# Patient Record
Sex: Female | Born: 1980 | Race: Black or African American | Hispanic: No | Marital: Single | Smoking: Current every day smoker
Health system: Southern US, Community
[De-identification: ages and names within clinical notes are randomized; demographics above are authoritative.]

## PROBLEM LIST (undated history)

## (undated) DIAGNOSIS — F6381 Intermittent explosive disorder: Secondary | ICD-10-CM

## (undated) DIAGNOSIS — M1990 Unspecified osteoarthritis, unspecified site: Secondary | ICD-10-CM

## (undated) DIAGNOSIS — E669 Obesity, unspecified: Secondary | ICD-10-CM

## (undated) DIAGNOSIS — M25569 Pain in unspecified knee: Secondary | ICD-10-CM

## (undated) DIAGNOSIS — F32A Depression, unspecified: Secondary | ICD-10-CM

## (undated) DIAGNOSIS — F419 Anxiety disorder, unspecified: Secondary | ICD-10-CM

## (undated) DIAGNOSIS — I10 Essential (primary) hypertension: Secondary | ICD-10-CM

## (undated) HISTORY — PX: CHOLECYSTECTOMY: SHX55

## (undated) HISTORY — DX: Intermittent explosive disorder: F63.81

## (undated) HISTORY — DX: Anxiety disorder, unspecified: F41.9

## (undated) HISTORY — DX: Depression, unspecified: F32.A

---
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
## 2002-01-22 ENCOUNTER — Inpatient Hospital Stay (HOSPITAL_COMMUNITY): Admission: AD | Admit: 2002-01-22 | Discharge: 2002-01-22 | Payer: Self-pay | Admitting: Obstetrics and Gynecology

## 2002-01-22 ENCOUNTER — Encounter: Payer: Self-pay | Admitting: Obstetrics and Gynecology

## 2002-02-21 ENCOUNTER — Inpatient Hospital Stay (HOSPITAL_COMMUNITY): Admission: AD | Admit: 2002-02-21 | Discharge: 2002-02-21 | Payer: Self-pay | Admitting: *Deleted

## 2002-03-05 ENCOUNTER — Inpatient Hospital Stay (HOSPITAL_COMMUNITY): Admission: AD | Admit: 2002-03-05 | Discharge: 2002-03-05 | Payer: Self-pay | Admitting: Obstetrics and Gynecology

## 2002-04-01 ENCOUNTER — Inpatient Hospital Stay (HOSPITAL_COMMUNITY): Admission: AD | Admit: 2002-04-01 | Discharge: 2002-04-01 | Payer: Self-pay | Admitting: Family Medicine

## 2002-05-07 ENCOUNTER — Inpatient Hospital Stay (HOSPITAL_COMMUNITY): Admission: AD | Admit: 2002-05-07 | Discharge: 2002-05-07 | Payer: Self-pay | Admitting: Obstetrics

## 2002-05-23 ENCOUNTER — Inpatient Hospital Stay (HOSPITAL_COMMUNITY): Admission: AD | Admit: 2002-05-23 | Discharge: 2002-05-23 | Payer: Self-pay | Admitting: Obstetrics

## 2002-05-28 ENCOUNTER — Encounter: Payer: Self-pay | Admitting: Obstetrics & Gynecology

## 2002-05-28 ENCOUNTER — Inpatient Hospital Stay (HOSPITAL_COMMUNITY): Admission: RE | Admit: 2002-05-28 | Discharge: 2002-05-28 | Payer: Self-pay | Admitting: Obstetrics & Gynecology

## 2002-06-02 ENCOUNTER — Inpatient Hospital Stay (HOSPITAL_COMMUNITY): Admission: AD | Admit: 2002-06-02 | Discharge: 2002-06-06 | Payer: Self-pay | Admitting: Obstetrics & Gynecology

## 2002-06-17 ENCOUNTER — Inpatient Hospital Stay (HOSPITAL_COMMUNITY): Admission: AD | Admit: 2002-06-17 | Discharge: 2002-06-17 | Payer: Self-pay | Admitting: Obstetrics

## 2002-06-29 ENCOUNTER — Emergency Department (HOSPITAL_COMMUNITY): Admission: EM | Admit: 2002-06-29 | Discharge: 2002-06-30 | Payer: Self-pay | Admitting: Emergency Medicine

## 2002-07-15 ENCOUNTER — Emergency Department (HOSPITAL_COMMUNITY): Admission: EM | Admit: 2002-07-15 | Discharge: 2002-07-15 | Payer: Self-pay | Admitting: Emergency Medicine

## 2002-07-15 ENCOUNTER — Encounter: Payer: Self-pay | Admitting: Emergency Medicine

## 2002-09-20 ENCOUNTER — Emergency Department (HOSPITAL_COMMUNITY): Admission: EM | Admit: 2002-09-20 | Discharge: 2002-09-20 | Payer: Self-pay | Admitting: Emergency Medicine

## 2002-10-22 ENCOUNTER — Encounter: Payer: Self-pay | Admitting: Emergency Medicine

## 2002-10-22 ENCOUNTER — Emergency Department (HOSPITAL_COMMUNITY): Admission: EM | Admit: 2002-10-22 | Discharge: 2002-10-22 | Payer: Self-pay | Admitting: Emergency Medicine

## 2002-11-05 ENCOUNTER — Emergency Department (HOSPITAL_COMMUNITY): Admission: EM | Admit: 2002-11-05 | Discharge: 2002-11-05 | Payer: Self-pay | Admitting: Emergency Medicine

## 2003-01-08 ENCOUNTER — Emergency Department (HOSPITAL_COMMUNITY): Admission: EM | Admit: 2003-01-08 | Discharge: 2003-01-08 | Payer: Self-pay | Admitting: Emergency Medicine

## 2003-03-02 ENCOUNTER — Emergency Department (HOSPITAL_COMMUNITY): Admission: EM | Admit: 2003-03-02 | Discharge: 2003-03-02 | Payer: Self-pay | Admitting: Emergency Medicine

## 2003-03-15 ENCOUNTER — Emergency Department (HOSPITAL_COMMUNITY): Admission: EM | Admit: 2003-03-15 | Discharge: 2003-03-15 | Payer: Self-pay | Admitting: Emergency Medicine

## 2003-03-17 ENCOUNTER — Emergency Department (HOSPITAL_COMMUNITY): Admission: EM | Admit: 2003-03-17 | Discharge: 2003-03-17 | Payer: Self-pay | Admitting: Emergency Medicine

## 2003-03-20 ENCOUNTER — Emergency Department (HOSPITAL_COMMUNITY): Admission: EM | Admit: 2003-03-20 | Discharge: 2003-03-20 | Payer: Self-pay | Admitting: Emergency Medicine

## 2003-03-20 ENCOUNTER — Emergency Department (HOSPITAL_COMMUNITY): Admission: EM | Admit: 2003-03-20 | Discharge: 2003-03-20 | Payer: Self-pay

## 2003-05-09 ENCOUNTER — Emergency Department (HOSPITAL_COMMUNITY): Admission: EM | Admit: 2003-05-09 | Discharge: 2003-05-09 | Payer: Self-pay | Admitting: Emergency Medicine

## 2003-05-20 ENCOUNTER — Emergency Department (HOSPITAL_COMMUNITY): Admission: EM | Admit: 2003-05-20 | Discharge: 2003-05-20 | Payer: Self-pay | Admitting: Emergency Medicine

## 2003-07-05 ENCOUNTER — Inpatient Hospital Stay (HOSPITAL_COMMUNITY): Admission: AD | Admit: 2003-07-05 | Discharge: 2003-07-05 | Payer: Self-pay | Admitting: Obstetrics

## 2003-07-12 ENCOUNTER — Emergency Department (HOSPITAL_COMMUNITY): Admission: EM | Admit: 2003-07-12 | Discharge: 2003-07-12 | Payer: Self-pay | Admitting: Emergency Medicine

## 2003-12-17 ENCOUNTER — Emergency Department (HOSPITAL_COMMUNITY): Admission: EM | Admit: 2003-12-17 | Discharge: 2003-12-17 | Payer: Self-pay

## 2003-12-20 ENCOUNTER — Emergency Department (HOSPITAL_COMMUNITY): Admission: EM | Admit: 2003-12-20 | Discharge: 2003-12-20 | Payer: Self-pay | Admitting: Emergency Medicine

## 2006-03-04 ENCOUNTER — Emergency Department (HOSPITAL_COMMUNITY): Admission: EM | Admit: 2006-03-04 | Discharge: 2006-03-04 | Payer: Self-pay | Admitting: Emergency Medicine

## 2006-03-06 ENCOUNTER — Emergency Department (HOSPITAL_COMMUNITY): Admission: EM | Admit: 2006-03-06 | Discharge: 2006-03-06 | Payer: Self-pay | Admitting: Emergency Medicine

## 2007-02-22 ENCOUNTER — Inpatient Hospital Stay (HOSPITAL_COMMUNITY): Admission: AD | Admit: 2007-02-22 | Discharge: 2007-02-22 | Payer: Self-pay | Admitting: Obstetrics & Gynecology

## 2007-03-01 ENCOUNTER — Inpatient Hospital Stay (HOSPITAL_COMMUNITY): Admission: AD | Admit: 2007-03-01 | Discharge: 2007-03-01 | Payer: Self-pay | Admitting: Obstetrics

## 2007-03-09 ENCOUNTER — Inpatient Hospital Stay (HOSPITAL_COMMUNITY): Admission: AD | Admit: 2007-03-09 | Discharge: 2007-03-09 | Payer: Self-pay | Admitting: Obstetrics and Gynecology

## 2007-04-07 ENCOUNTER — Inpatient Hospital Stay (HOSPITAL_COMMUNITY): Admission: AD | Admit: 2007-04-07 | Discharge: 2007-04-07 | Payer: Self-pay | Admitting: Obstetrics and Gynecology

## 2007-04-30 ENCOUNTER — Inpatient Hospital Stay (HOSPITAL_COMMUNITY): Admission: AD | Admit: 2007-04-30 | Discharge: 2007-04-30 | Payer: Self-pay | Admitting: Obstetrics & Gynecology

## 2007-06-06 ENCOUNTER — Ambulatory Visit (HOSPITAL_COMMUNITY): Admission: RE | Admit: 2007-06-06 | Discharge: 2007-06-06 | Payer: Self-pay | Admitting: Obstetrics & Gynecology

## 2007-06-18 ENCOUNTER — Ambulatory Visit (HOSPITAL_COMMUNITY): Admission: RE | Admit: 2007-06-18 | Discharge: 2007-06-18 | Payer: Self-pay | Admitting: Internal Medicine

## 2007-07-10 ENCOUNTER — Inpatient Hospital Stay (HOSPITAL_COMMUNITY): Admission: AD | Admit: 2007-07-10 | Discharge: 2007-07-10 | Payer: Self-pay | Admitting: Family Medicine

## 2007-07-22 ENCOUNTER — Ambulatory Visit (HOSPITAL_COMMUNITY): Admission: RE | Admit: 2007-07-22 | Discharge: 2007-07-22 | Payer: Self-pay | Admitting: Obstetrics & Gynecology

## 2007-08-19 ENCOUNTER — Ambulatory Visit (HOSPITAL_COMMUNITY): Admission: RE | Admit: 2007-08-19 | Discharge: 2007-08-19 | Payer: Self-pay | Admitting: Obstetrics & Gynecology

## 2007-08-20 ENCOUNTER — Ambulatory Visit: Payer: Self-pay | Admitting: Obstetrics and Gynecology

## 2007-08-20 ENCOUNTER — Inpatient Hospital Stay (HOSPITAL_COMMUNITY): Admission: AD | Admit: 2007-08-20 | Discharge: 2007-08-20 | Payer: Self-pay | Admitting: Family Medicine

## 2007-08-28 ENCOUNTER — Ambulatory Visit: Payer: Self-pay | Admitting: Family Medicine

## 2007-08-28 ENCOUNTER — Inpatient Hospital Stay (HOSPITAL_COMMUNITY): Admission: RE | Admit: 2007-08-28 | Discharge: 2007-08-28 | Payer: Self-pay | Admitting: Obstetrics & Gynecology

## 2007-09-16 ENCOUNTER — Ambulatory Visit: Payer: Self-pay | Admitting: Obstetrics and Gynecology

## 2007-09-16 ENCOUNTER — Inpatient Hospital Stay (HOSPITAL_COMMUNITY): Admission: AD | Admit: 2007-09-16 | Discharge: 2007-09-16 | Payer: Self-pay | Admitting: Family Medicine

## 2007-09-16 ENCOUNTER — Encounter: Payer: Self-pay | Admitting: *Deleted

## 2007-09-19 ENCOUNTER — Inpatient Hospital Stay (HOSPITAL_COMMUNITY): Admission: AD | Admit: 2007-09-19 | Discharge: 2007-09-19 | Payer: Self-pay | Admitting: Obstetrics & Gynecology

## 2010-02-13 ENCOUNTER — Encounter: Payer: Self-pay | Admitting: Emergency Medicine

## 2010-02-14 ENCOUNTER — Encounter: Payer: Self-pay | Admitting: *Deleted

## 2010-10-13 LAB — URINALYSIS, ROUTINE W REFLEX MICROSCOPIC
Bilirubin Urine: NEGATIVE
Glucose, UA: NEGATIVE
Hgb urine dipstick: NEGATIVE
Ketones, ur: NEGATIVE
Nitrite: NEGATIVE
Protein, ur: NEGATIVE
Specific Gravity, Urine: 1.025
Urobilinogen, UA: 0.2
pH: 5.5

## 2010-10-13 LAB — POCT PREGNANCY, URINE
Operator id: 11897
Preg Test, Ur: POSITIVE

## 2010-10-13 LAB — GC/CHLAMYDIA PROBE AMP, GENITAL
Chlamydia, DNA Probe: NEGATIVE
GC Probe Amp, Genital: NEGATIVE

## 2010-10-13 LAB — WET PREP, GENITAL
Clue Cells Wet Prep HPF POC: NONE SEEN
Yeast Wet Prep HPF POC: NONE SEEN

## 2010-10-13 LAB — ABO/RH: ABO/RH(D): O POS

## 2010-10-13 LAB — HCG, QUANTITATIVE, PREGNANCY: hCG, Beta Chain, Quant, S: 1496 — ABNORMAL HIGH

## 2010-10-14 LAB — URINALYSIS, ROUTINE W REFLEX MICROSCOPIC
Glucose, UA: NEGATIVE
Hgb urine dipstick: NEGATIVE
Nitrite: NEGATIVE
Protein, ur: NEGATIVE
Protein, ur: NEGATIVE
Specific Gravity, Urine: 1.01
Urobilinogen, UA: 1
Urobilinogen, UA: 0.2

## 2010-10-14 LAB — HCG, QUANTITATIVE, PREGNANCY: hCG, Beta Chain, Quant, S: 5013 — ABNORMAL HIGH

## 2010-10-17 LAB — URINALYSIS, ROUTINE W REFLEX MICROSCOPIC
Bilirubin Urine: NEGATIVE
Ketones, ur: NEGATIVE
Nitrite: NEGATIVE
Protein, ur: NEGATIVE
pH: 5.5

## 2010-10-17 LAB — WET PREP, GENITAL

## 2010-10-18 LAB — WET PREP, GENITAL
Trich, Wet Prep: NONE SEEN
Yeast Wet Prep HPF POC: NONE SEEN

## 2010-10-18 LAB — GC/CHLAMYDIA PROBE AMP, GENITAL: Chlamydia, DNA Probe: NEGATIVE

## 2010-10-20 LAB — URINALYSIS, ROUTINE W REFLEX MICROSCOPIC
Bilirubin Urine: NEGATIVE
Glucose, UA: NEGATIVE
Ketones, ur: 15 — AB
Nitrite: NEGATIVE
Protein, ur: NEGATIVE
pH: 5.5

## 2010-10-20 LAB — WET PREP, GENITAL
Trich, Wet Prep: NONE SEEN
Yeast Wet Prep HPF POC: NONE SEEN

## 2010-10-21 LAB — COMPREHENSIVE METABOLIC PANEL
ALT: 13
AST: 17
Albumin: 2.6 — ABNORMAL LOW
Alkaline Phosphatase: 76
BUN: 2 — ABNORMAL LOW
CO2: 23
Calcium: 9
Chloride: 103
Creatinine, Ser: 0.47
GFR calc Af Amer: >60
GFR calc non Af Amer: >60
Glucose, Bld: 89
Potassium: 3.7
Sodium: 134 — ABNORMAL LOW
Total Bilirubin: 0.3
Total Protein: 5.9 — ABNORMAL LOW

## 2010-10-21 LAB — CBC
HCT: 36.2
Hemoglobin: 11.9 — ABNORMAL LOW
MCHC: 32.8
MCV: 82.4
Platelets: 176
RBC: 4.4
RDW: 15.3
WBC: 8

## 2010-10-21 LAB — URINALYSIS, ROUTINE W REFLEX MICROSCOPIC
Bilirubin Urine: NEGATIVE
Bilirubin Urine: NEGATIVE
Glucose, UA: NEGATIVE
Hgb urine dipstick: NEGATIVE
Ketones, ur: NEGATIVE
Ketones, ur: NEGATIVE
Nitrite: NEGATIVE
Nitrite: NEGATIVE
Protein, ur: NEGATIVE
Protein, ur: NEGATIVE
Specific Gravity, Urine: 1.02
Urobilinogen, UA: 0.2
Urobilinogen, UA: 0.2
pH: 5.5
pH: 6

## 2010-10-21 LAB — URINE MICROSCOPIC-ADD ON

## 2010-10-21 LAB — WET PREP, GENITAL
Clue Cells Wet Prep HPF POC: NONE SEEN
Clue Cells Wet Prep HPF POC: NONE SEEN
Trich, Wet Prep: NONE SEEN
Trich, Wet Prep: NONE SEEN
Yeast Wet Prep HPF POC: NONE SEEN
Yeast Wet Prep HPF POC: NONE SEEN

## 2010-10-21 LAB — KLEIHAUER-BETKE STAIN
Fetal Cells %: 0
Quantitation Fetal Hemoglobin: 0

## 2011-01-12 ENCOUNTER — Encounter: Payer: Self-pay | Admitting: Emergency Medicine

## 2011-01-12 ENCOUNTER — Emergency Department (HOSPITAL_COMMUNITY): Payer: Self-pay

## 2011-01-12 ENCOUNTER — Other Ambulatory Visit: Payer: Self-pay

## 2011-01-12 ENCOUNTER — Emergency Department (HOSPITAL_COMMUNITY)
Admission: EM | Admit: 2011-01-12 | Discharge: 2011-01-13 | Disposition: A | Discharge status: Home / Self Care | Payer: Self-pay | Attending: Emergency Medicine | Admitting: Emergency Medicine

## 2011-01-12 DIAGNOSIS — R197 Diarrhea, unspecified: Secondary | ICD-10-CM | POA: Insufficient documentation

## 2011-01-12 DIAGNOSIS — F17200 Nicotine dependence, unspecified, uncomplicated: Secondary | ICD-10-CM | POA: Insufficient documentation

## 2011-01-12 DIAGNOSIS — I10 Essential (primary) hypertension: Secondary | ICD-10-CM | POA: Insufficient documentation

## 2011-01-12 DIAGNOSIS — M79609 Pain in unspecified limb: Secondary | ICD-10-CM | POA: Insufficient documentation

## 2011-01-12 DIAGNOSIS — R0789 Other chest pain: Secondary | ICD-10-CM | POA: Insufficient documentation

## 2011-01-12 DIAGNOSIS — R0602 Shortness of breath: Secondary | ICD-10-CM | POA: Insufficient documentation

## 2011-01-12 DIAGNOSIS — R059 Cough, unspecified: Secondary | ICD-10-CM | POA: Insufficient documentation

## 2011-01-12 DIAGNOSIS — R05 Cough: Secondary | ICD-10-CM

## 2011-01-12 DIAGNOSIS — R110 Nausea: Secondary | ICD-10-CM | POA: Insufficient documentation

## 2011-01-12 HISTORY — DX: Obesity, unspecified: E66.9

## 2011-01-12 HISTORY — DX: Essential (primary) hypertension: I10

## 2011-01-12 LAB — POCT I-STAT, CHEM 8
BUN: 11 mg/dL (ref 6–23)
Chloride: 109 mEq/L (ref 96–112)
Sodium: 144 mEq/L (ref 135–145)

## 2011-01-12 NOTE — ED Notes (Signed)
PT. REPORTS SUBSTERNAL CHEST PAIN RADIATING TO LEFT SHOULDER AND LEFT ARM ONSET THIS EVENING ,  SOB , DENIES COUGH ,  SLIGHT NAUSEA , DENIES DIAPHORESIS .

## 2011-01-12 NOTE — ED Provider Notes (Signed)
History     CSN: [id number]  Arrival date & time 01/12/11  2001   First MD Initiated Contact with Patient 01/12/11 2320      Chief Complaint  Patient presents with  . Chest Pain    HPI  History provided by the patient. Patient is a 30 year old female with history of hypertension who presents with complaints of chest pain and tightness that began around 5 PM today. Pain radiates to left upper extremity. Patient denies any aggravating or alleviating factors. Pain is nonpleuritic. Patient reports having a slight cough. She also reports having 4 episodes of loose "diarrhea" like stools. Patient denies any vomiting. She denies any abdominal pains. Patient has no recent travel, surgery, history of cancer, history of DVT, use of estrogen or birth control, no hemoptysis. Patient has no other significant past medical history.   Past Medical History  Diagnosis Date  . Hypertension   . Obesity     Past Surgical History  Procedure Date  . Cholecystectomy     No family history on file.  History  Substance Use Topics  . Smoking status: Current Everyday Smoker  . Smokeless tobacco: Not on file  . Alcohol Use: No    OB History    Grav Para Term Preterm Abortions TAB SAB Ect Mult Living                  Review of Systems  Constitutional: Negative for fever, chills and diaphoresis.  Respiratory: Positive for cough and shortness of breath. Negative for wheezing.   Cardiovascular: Positive for chest pain.  Gastrointestinal: Positive for nausea and diarrhea. Negative for vomiting.  Neurological: Negative for dizziness and light-headedness.  All other systems reviewed and are negative.    Allergies  Review of patient's allergies indicates no known allergies.  Home Medications   Current Outpatient Rx  Name Route Sig Dispense Refill  . TRIAMTERENE-HCTZ 37.5-25 MG PO TABS Oral Take 1 tablet by mouth daily.        BP 132/72  Pulse 58  Temp(Src) 99.5 F (37.5 C) (Oral)  Resp  20  SpO2 96%  LMP 01/07/2011  Physical Exam  Nursing note and vitals reviewed. Constitutional: She is oriented to person, place, and time. She appears well-developed and well-nourished. No distress.       Obese  HENT:  Head: Normocephalic.  Cardiovascular: Normal rate, regular rhythm and normal heart sounds.   Pulmonary/Chest: Effort normal and breath sounds normal. She has no wheezes. She has no rales. She exhibits no tenderness.  Abdominal: Soft. There is no tenderness.  Musculoskeletal: Normal range of motion.  Neurological: She is alert and oriented to person, place, and time.  Skin: Skin is warm and dry. No rash noted.  Psychiatric: She has a normal mood and affect. Her behavior is normal.    ED Course  Procedures (including critical care time)  Labs Reviewed  POCT I-STAT, CHEM 8 - Abnormal; Notable for the following:    Hemoglobin 11.9 (*)    HCT 35.0 (*)    All other components within normal limits  URINE RAPID DRUG SCREEN (HOSP PERFORMED)  POCT PREGNANCY, URINE  LAB REPORT - SCANNED    Results for orders placed during the hospital encounter of 01/12/11  URINE RAPID DRUG SCREEN (HOSP PERFORMED)      Component Value Range   Opiates NONE DETECTED  NONE DETECTED    Cocaine NONE DETECTED  NONE DETECTED    Benzodiazepines NONE DETECTED  NONE DETECTED  Amphetamines NONE DETECTED  NONE DETECTED    Tetrahydrocannabinol NONE DETECTED  NONE DETECTED    Barbiturates NONE DETECTED  NONE DETECTED   POCT I-STAT, CHEM 8      Component Value Range   Sodium 144  135 - 145 (mEq/L)   Potassium 3.9  3.5 - 5.1 (mEq/L)   Chloride 109  96 - 112 (mEq/L)   BUN 11  6 - 23 (mg/dL)   Creatinine, Ser 1.47  0.50 - 1.10 (mg/dL)   Glucose, Bld 85  70 - 99 (mg/dL)   Calcium, Ion 8.29  5.62 - 1.32 (mmol/L)   TCO2 24  0 - 100 (mmol/L)   Hemoglobin 11.9 (*) 12.0 - 15.0 (g/dL)   HCT 13.0 (*) 86.5 - 46.0 (%)  POCT PREGNANCY, URINE      Component Value Range   Preg Test, Ur NEGATIVE         Dg Chest 2 View  01/12/2011  *RADIOLOGY REPORT*  Clinical Data: Chest pain  CHEST - 2 VIEW  Comparison: 03/06/2006.  Findings: Low lung volumes.  Mild interstitial prominence could represent chronic bronchitis or viral pneumonitis.  Slight worsening aeration compared with priors.  No lobar consolidation, effusion, or pneumothorax.  Normal heart size for the degree of inspiration.  IMPRESSION:  Mild interstitial prominence.  Question chronic bronchitis or viral pneumonitis.  Low lung volumes.  Original Report Authenticated By: Elsie Stain, M.D.     1. Cough   2. Chest pain       MDM  11:15 PM patient seen and evaluated. Patient in no acute distress.  Pt is PERC negative.  Pt also complains of being out of BP meds.  Will give refill for her normal Rx.     Date: 01/12/2011  Rate: 65  Rhythm: normal sinus rhythm  QRS Axis: normal  Intervals: normal  ST/T Wave abnormalities: normal  Conduction Disutrbances:none  Narrative Interpretation:   Old EKG Reviewed: unchanged from 07/12/2003       Angus Seller, PA 01/13/11 [phone]

## 2011-01-12 NOTE — ED Notes (Signed)
The pt is sound asleep.  She is in no distress

## 2011-01-13 LAB — RAPID URINE DRUG SCREEN, HOSP PERFORMED
Barbiturates: NOT DETECTED
Cocaine: NOT DETECTED

## 2011-01-13 MED ORDER — IBUPROFEN 800 MG PO TABS
800.0000 mg | ORAL_TABLET | Freq: Once | ORAL | Status: AC
  Administered 2011-01-13: 800 mg via ORAL
  Filled 2011-01-13: qty 1

## 2011-01-13 MED ORDER — TRIAMTERENE-HCTZ 37.5-25 MG PO TABS: 1.0000 | ORAL_TABLET | Freq: Every day | ORAL | Status: DC

## 2011-01-13 MED ORDER — ALBUTEROL SULFATE HFA 108 (90 BASE) MCG/ACT IN AERS
2.0000 | INHALATION_SPRAY | RESPIRATORY_TRACT | Status: DC | PRN
  Administered 2011-01-13: 2 via RESPIRATORY_TRACT
  Filled 2011-01-13: qty 6.7

## 2011-01-13 NOTE — ED Notes (Signed)
The pt has been asleep since she arrived in stretcher triage.

## 2011-01-13 NOTE — ED Notes (Signed)
i was able to get the pt awake  Finally and  Took her to the br for a urine specimen

## 2011-01-13 NOTE — ED Notes (Signed)
The pt roused up saying she has chest pain for  Awhile worse tonight.  The pt has fallen back to sleep

## 2011-01-13 NOTE — ED Notes (Signed)
The pt  Woke up again and said no one had talked to her.

## 2011-01-14 NOTE — ED Provider Notes (Signed)
Medical screening examination/treatment/procedure(s) were performed by non-physician practitioner and as supervising physician I was immediately available for consultation/collaboration.  Jo Cerone K Mohini Heathcock-Rasch, MD 01/14/11 0133 

## 2011-02-07 ENCOUNTER — Emergency Department (HOSPITAL_COMMUNITY)
Admission: EM | Admit: 2011-02-07 | Discharge: 2011-02-07 | Disposition: A | Discharge status: Home / Self Care | Payer: Self-pay | Attending: Emergency Medicine | Admitting: Emergency Medicine

## 2011-02-07 ENCOUNTER — Emergency Department (HOSPITAL_COMMUNITY): Payer: Self-pay

## 2011-02-07 ENCOUNTER — Encounter (HOSPITAL_COMMUNITY): Payer: Self-pay | Admitting: *Deleted

## 2011-02-07 DIAGNOSIS — S8990XA Unspecified injury of unspecified lower leg, initial encounter: Secondary | ICD-10-CM

## 2011-02-07 DIAGNOSIS — M1711 Unilateral primary osteoarthritis, right knee: Secondary | ICD-10-CM

## 2011-02-07 DIAGNOSIS — W010XXA Fall on same level from slipping, tripping and stumbling without subsequent striking against object, initial encounter: Secondary | ICD-10-CM | POA: Insufficient documentation

## 2011-02-07 DIAGNOSIS — M25569 Pain in unspecified knee: Secondary | ICD-10-CM | POA: Insufficient documentation

## 2011-02-07 DIAGNOSIS — M1710 Unilateral primary osteoarthritis, unspecified knee: Secondary | ICD-10-CM | POA: Insufficient documentation

## 2011-02-07 DIAGNOSIS — I10 Essential (primary) hypertension: Secondary | ICD-10-CM | POA: Insufficient documentation

## 2011-02-07 DIAGNOSIS — IMO0002 Reserved for concepts with insufficient information to code with codable children: Secondary | ICD-10-CM | POA: Insufficient documentation

## 2011-02-07 MED ORDER — OXYCODONE-ACETAMINOPHEN 5-325 MG PO TABS: 2.0000 | ORAL_TABLET | ORAL | Status: AC | PRN

## 2011-02-07 MED ORDER — OXYCODONE-ACETAMINOPHEN 5-325 MG PO TABS: 2.0000 | ORAL_TABLET | ORAL | Status: DC | PRN

## 2011-02-07 MED ORDER — OXYCODONE-ACETAMINOPHEN 5-325 MG PO TABS
1.0000 | ORAL_TABLET | Freq: Once | ORAL | Status: AC
  Administered 2011-02-07: 1 via ORAL
  Filled 2011-02-07: qty 1

## 2011-02-07 NOTE — ED Notes (Signed)
C/o rt knee pain for 3 days.  She has had problems with this knee in the past

## 2011-02-07 NOTE — ED Provider Notes (Signed)
History     CSN: [id number]  Arrival date & time 02/07/11  0004   First MD Initiated Contact with Patient 02/07/11 [phone]      Chief Complaint  Patient presents with  . Knee Pain    (Consider location/radiation/quality/duration/timing/severity/associated sxs/prior treatment) HPI April Warner is a 31 y.o. female  presents with c/o right knee pain leading to desire to be assessed in the ED. The sx(s) have been present for 1 day. Additional concerns are she twisted it and fell while walking. Causative factors are possibly related to the twisting today. Palliative factors are rest. The distress associated is mild. The disorder has been present for 1 day. She intermittently has pain in the right knee. She believes she dislocated the kneecap 10 years ago. She's also been told she had arthritis in the right knee. The she's endotracheal to the emergency room by private vehicle. She denies nausea, vomiting, fever, chills, back pain, or neck pain    Past Medical History  Diagnosis Date  . Hypertension   . Obesity     Past Surgical History  Procedure Date  . Cholecystectomy     History reviewed. No pertinent family history.  History  Substance Use Topics  . Smoking status: Current Everyday Smoker  . Smokeless tobacco: Not on file  . Alcohol Use: No    OB History    Grav Para Term Preterm Abortions TAB SAB Ect Mult Living                  Review of Systems  All other systems reviewed and are negative.    Allergies  Review of patient's allergies indicates no known allergies.  Home Medications   Current Outpatient Rx  Name Route Sig Dispense Refill  . TRIAMTERENE-HCTZ 37.5-25 MG PO TABS Oral Take 1 tablet by mouth daily.     . OXYCODONE-ACETAMINOPHEN 5-325 MG PO TABS Oral Take 2 tablets by mouth every 4 (four) hours as needed for pain. 15 tablet 0    BP 166/87  Pulse 95  Temp(Src) 99.3 F (37.4 C) (Oral)  Resp 19  SpO2 97%  LMP 12/09/2010  Physical Exam    Nursing note and vitals reviewed. Constitutional: She is oriented to person, place, and time. She appears well-developed and well-nourished.       She is morbidly obese  HENT:  Head: Normocephalic and atraumatic.  Neck: Normal range of motion and phonation normal. Neck supple.  Cardiovascular: Intact distal pulses.   Pulmonary/Chest: Effort normal. She exhibits no tenderness.  Musculoskeletal: She exhibits no edema.       Right knee is diffusely tender without swelling, crepitation or palpable effusion. The knee is grossly stable to stress. Patellar extension is intact.  Neurological: She is alert and oriented to person, place, and time. She has normal strength and normal reflexes. She exhibits normal muscle tone.  Skin: Skin is warm and dry.  Psychiatric: She has a normal mood and affect. Her behavior is normal. Judgment and thought content normal.    ED Course  Procedures (including critical care time)  Emergency department treatment: Percocet for pain Ace wrap for support of the right knee.   Labs Reviewed - No data to display Dg Knee Complete 4 Views Right  02/07/2011  *RADIOLOGY REPORT*  Clinical Data: Right knee pain for 3 days.  RIGHT KNEE - COMPLETE 4+ VIEW  Comparison: None.  Findings: There is no evidence of fracture or dislocation.  The joint spaces are preserved.  Mild  marginal osteophyte formation is noted at all three compartments.  A small knee joint effusion is suspected.  The visualized soft tissues are otherwise unremarkable in appearance.  IMPRESSION:  1.  No evidence of fracture or dislocation. 2.  Mild degenerative change at the right knee. 3.  Small knee joint effusion suspected.  Original Report Authenticated By: Tonia Ghent, M.D.     1. Knee injury   2. Degenerative joint disease of knee, right       MDM  Nonspecific knee injury with pain. DJD on x-ray, doubt ligamentous injury       Flint Melter, MD 02/07/11 0425

## 2011-02-07 NOTE — ED Notes (Signed)
Pt back to radio

## 2011-02-07 NOTE — ED Notes (Signed)
Pt up walked to [facility] with slight limp. Talked with pt about need to loose weight to relieve pressure on joints. Pt verbalized understanding and pt has no ride home. No bus passes available. Had narcotic pain med. Less than 2 hours ago and pt has no one to pick her up as she drove self to ER. Rates pain 5.

## 2011-02-07 NOTE — ED Notes (Signed)
Pt became upset in hall after learning no bus passes and that she didn't drive but was dropped off.  Pt on phone and cussing with someone stating she is " mad as Laroy Apple"  And ready to get the F out of here. Told pt that she could wait in lobby til ride comes.

## 2011-02-10 ENCOUNTER — Emergency Department (HOSPITAL_COMMUNITY)
Admission: EM | Admit: 2011-02-10 | Discharge: 2011-02-10 | Disposition: A | Discharge status: Home / Self Care | Payer: Self-pay | Attending: Emergency Medicine | Admitting: Emergency Medicine

## 2011-02-10 ENCOUNTER — Encounter (HOSPITAL_COMMUNITY): Payer: Self-pay | Admitting: Emergency Medicine

## 2011-02-10 DIAGNOSIS — M25569 Pain in unspecified knee: Secondary | ICD-10-CM

## 2011-02-10 DIAGNOSIS — I10 Essential (primary) hypertension: Secondary | ICD-10-CM | POA: Insufficient documentation

## 2011-02-10 MED ORDER — OXYCODONE-ACETAMINOPHEN 5-325 MG PO TABS
1.0000 | ORAL_TABLET | Freq: Once | ORAL | Status: AC
  Administered 2011-02-10: 1 via ORAL
  Filled 2011-02-10: qty 1

## 2011-02-10 MED ORDER — OXYCODONE-ACETAMINOPHEN 5-325 MG PO TABS: 1.0000 | ORAL_TABLET | Freq: Four times a day (QID) | ORAL | Status: AC | PRN

## 2011-02-10 NOTE — ED Notes (Signed)
Pt presenting to ed with c/o bilateral knee pain pt states she is out of her medications. Pt states her knees just feel like they are giving out on her. Pt states she does not have a pcp currently and she was released from prison not to long ago.

## 2011-02-10 NOTE — ED Provider Notes (Signed)
History     CSN: [id number]  Arrival date & time 02/10/11  1419   First MD Initiated Contact with Patient 02/10/11 1435      Chief Complaint  Patient presents with  . Knee Pain    (Consider location/radiation/quality/duration/timing/severity/associated sxs/prior treatment) HPI Comments: Patient was evaluated for right knee pain in the ED three days ago.  At that time an xray was performed, which showed degenerative changes of the right knee, but no fracture or dislocation.  She reports that she has not had an acute injury since that time.  She reports that her knee has felt unstable and she feels like it is going to give out on her.  She was given an ACE wrap while she was in the ED 3 days ago.  She is now requesting for something with more support.  She was given oxycodone prescription while in the ED three days ago, which she reports helped with the pain.  She ran out of the medication.  She reports that she has an appointment scheduled with Orthopedics on 02-15-11.    The history is provided by the patient.    Past Medical History  Diagnosis Date  . Hypertension   . Obesity     Past Surgical History  Procedure Date  . Cholecystectomy     No family history on file.  History  Substance Use Topics  . Smoking status: Current Everyday Smoker  . Smokeless tobacco: Not on file  . Alcohol Use: No    OB History    Grav Para Term Preterm Abortions TAB SAB Ect Mult Living                  Review of Systems  Constitutional: Negative for fever and chills.  Eyes: Negative for visual disturbance.  Respiratory: Negative for shortness of breath.   Cardiovascular: Negative for chest pain.  Musculoskeletal: Positive for arthralgias. Negative for joint swelling and gait problem.  Skin: Negative for color change and rash.  Neurological: Negative for dizziness, syncope and light-headedness.    Allergies  Review of patient's allergies indicates no known allergies.  Home  Medications   Current Outpatient Rx  Name Route Sig Dispense Refill  . OXYCODONE-ACETAMINOPHEN 5-325 MG PO TABS Oral Take 2 tablets by mouth every 4 (four) hours as needed for pain. 15 tablet 0  . TRIAMTERENE-HCTZ 37.5-25 MG PO TABS Oral Take 1 tablet by mouth daily.       BP 157/104  Pulse 84  Temp(Src) 98 F (36.7 C) (Oral)  Resp 20  SpO2 96%  LMP 12/09/2010  Physical Exam  Nursing note and vitals reviewed. Constitutional: She is oriented to person, place, and time. She appears well-developed and well-nourished. No distress.       Patient is morbidly obese.  HENT:  Head: Normocephalic and atraumatic.  Neck: Normal range of motion. Neck supple.  Cardiovascular: Normal rate, regular rhythm and normal heart sounds.   Pulmonary/Chest: Effort normal and breath sounds normal.  Musculoskeletal:       Right knee: She exhibits normal range of motion, no swelling, no effusion, no deformity, no erythema, no LCL laxity and no MCL laxity. no tenderness found.  Neurological: She is alert and oriented to person, place, and time. No sensory deficit. Gait normal.  Skin: Skin is warm and dry. She is not diaphoretic. No erythema.  Psychiatric: She has a normal mood and affect.    ED Course  Procedures (including critical care time)  Labs Reviewed -  No data to display No results found.   No diagnosis found.  3:05 PM Knee brace ordered for patient. 3:21 PM Ortho tech came to the patient's room.  However, the XL knee brace would not fit the patient's knee.    MDM  Patient had a recent xray of her right knee 3 days ago.  No trauma since that time.  Therefore, do not feel that further imaging is indicated.  No erythema, warmth, obvious effusion, or deformity of the knee.  Patient has appointment with orthopedics scheduled on 02-15-11.  Patient given short course of pain medication.  Patient encouraged to follow up with PCP for pain management.  Patient given resource guide.  Patient explained  that narcotic pain medication is typically not given in the ED for chronic problems and that she needs to follow up with orthopedics and find a PCP.        Pascal Lux Moville, PA-C 02/10/11 1537

## 2011-02-11 NOTE — ED Provider Notes (Signed)
Medical screening examination/treatment/procedure(s) were performed by non-physician practitioner and as supervising physician I was immediately available for consultation/collaboration.  Chyla Schlender, MD 02/11/11 2144 

## 2011-06-13 ENCOUNTER — Emergency Department (HOSPITAL_COMMUNITY)
Admission: EM | Admit: 2011-06-13 | Discharge: 2011-06-13 | Disposition: A | Discharge status: Home / Self Care | Payer: Self-pay | Attending: Emergency Medicine | Admitting: Emergency Medicine

## 2011-06-13 ENCOUNTER — Encounter (HOSPITAL_COMMUNITY): Payer: Self-pay | Admitting: *Deleted

## 2011-06-13 DIAGNOSIS — E669 Obesity, unspecified: Secondary | ICD-10-CM | POA: Insufficient documentation

## 2011-06-13 DIAGNOSIS — I10 Essential (primary) hypertension: Secondary | ICD-10-CM | POA: Insufficient documentation

## 2011-06-13 DIAGNOSIS — M25569 Pain in unspecified knee: Secondary | ICD-10-CM | POA: Insufficient documentation

## 2011-06-13 DIAGNOSIS — M7989 Other specified soft tissue disorders: Secondary | ICD-10-CM | POA: Insufficient documentation

## 2011-06-13 HISTORY — DX: Unspecified osteoarthritis, unspecified site: M19.90

## 2011-06-13 MED ORDER — OXYCODONE-ACETAMINOPHEN 5-325 MG PO TABS: 1.0000 | ORAL_TABLET | ORAL | Status: AC | PRN

## 2011-06-13 MED ORDER — OXYCODONE-ACETAMINOPHEN 5-325 MG PO TABS
1.0000 | ORAL_TABLET | Freq: Once | ORAL | Status: AC
  Administered 2011-06-13: 1 via ORAL
  Filled 2011-06-13: qty 1

## 2011-06-13 MED ORDER — IBUPROFEN 600 MG PO TABS: 600.0000 mg | ORAL_TABLET | Freq: Four times a day (QID) | ORAL | Status: AC | PRN

## 2011-06-13 NOTE — ED Notes (Signed)
Per EMS- pt in c/o bilateral knee pain, pt with history of arthritis and states her knees "went out" on her while walking down stairs today, pt ambulatory at house

## 2011-06-13 NOTE — ED Provider Notes (Signed)
History     CSN: [id number]  Arrival date & time 06/13/11  1345   First MD Initiated Contact with Patient 06/13/11 1406      Chief Complaint  Patient presents with  . Knee Pain    (Consider location/radiation/quality/duration/timing/severity/associated sxs/prior treatment) Patient is a 31 y.o. female presenting with knee pain. The history is provided by the patient.  Knee Pain This is a new problem. The current episode started today. The problem occurs constantly. Associated symptoms include arthralgias and joint swelling. Pertinent negatives include no chills, fever, numbness or weakness.  Pt reports right knee pain that started today.  States she has chronic knee pain, states it worsened today. Painful to walk on it. Pt states her knee "gave out." Denies fall or recent injuries. States unable to see orthopedics because cannot pay. States has been working on losing weight. States  already lost 70lb, states working out several times a week. No other complaints. Denies fever, chills.  Past Medical History  Diagnosis Date  . Hypertension   . Obesity   . Arthritis     Past Surgical History  Procedure Date  . Cholecystectomy     History reviewed. No pertinent family history.  History  Substance Use Topics  . Smoking status: Current Everyday Smoker  . Smokeless tobacco: Not on file  . Alcohol Use: No    OB History    Grav Para Term Preterm Abortions TAB SAB Ect Mult Living                  Review of Systems  Constitutional: Negative for fever and chills.  Respiratory: Negative.   Cardiovascular: Negative.   Musculoskeletal: Positive for joint swelling and arthralgias. Negative for back pain.  Skin: Negative.   Neurological: Negative for weakness and numbness.    Allergies  Review of patient's allergies indicates no known allergies.  Home Medications  No current outpatient prescriptions on file.  BP 140/87  Pulse 76  Temp(Src) 98.1 F (36.7 C) (Oral)  Resp  20  SpO2 95%  Physical Exam  Nursing note and vitals reviewed. Constitutional: She is oriented to person, place, and time. She appears well-developed and well-nourished. No distress.  HENT:  Head: Normocephalic.  Eyes: Conjunctivae are normal.  Neck: Neck supple.  Cardiovascular: Normal rate, regular rhythm and normal heart sounds.   Pulmonary/Chest: Effort normal and breath sounds normal. No respiratory distress. She has no wheezes. She has no rales.  Musculoskeletal:       Right knee normal appearing, other than obese. No redness, or obvious swelling. Pain with ROM, full ROM. Pt able to bear weight. Knee joint stable with negative anterior and posterior drawer signs. NO laxity or pain with medial or lateral stress.  Neurological: She is alert and oriented to person, place, and time.  Skin: Skin is warm and dry.  Psychiatric: She has a normal mood and affect.    ED Course  Procedures (including critical care time)  Pt with exacerbation of chronic pain. No new injuries. Exam unremarkable. Will treat with pain medications. Called social worker to speak with pt about resources for her follow up. She has referral to orthopedics but unable to pay co pay there.   1. Knee pain       MDM          Lottie Mussel, PA 06/13/11 1534

## 2011-06-13 NOTE — Progress Notes (Signed)
ED CM contacted to speak with patient about resources by ED RN.  Pt states she is a [employer]ic[employer] who has returned to school with children and spouse Reports she stands on her feet for long periods of time.  Reports she has family members who have requested she get disability/medicaid "for bad arthritis in knees" but she does not want disability/medicaid.  Reports she has only been to ED three times.  CM spoke with pt about disability requirements, DSS application process, [place] self pay pcps/general co pay amounts and possible referral from [employer] for insurance coverage (inclusive health, BCBS, etc). Provided with written information for pcps, medications and possible insurance coverage information.  Pt voiced understanding of information provided.  Pt noted previously to be standing at triage nursing station tearful speaking on telephone.

## 2011-06-13 NOTE — ED Notes (Signed)
Pt states her right knee went out on her while she was going down the stairs but both knees have been bothering her, states she has history of same and is out of pain medication for this

## 2011-06-13 NOTE — Discharge Instructions (Signed)
Keep your knee elevated. Ice several times a day. Try ACE wrap for compression. Ibuprofen for pain. Percocet for severe pain. Follow up with a family doctor or orthopedics as soon as able.   Knee Pain The knee is the complex joint between your thigh and your lower leg. It is made up of bones, tendons, ligaments, and cartilage. The bones that make up the knee are:  The femur in the thigh.   The tibia and fibula in the lower leg.   The patella or kneecap riding in the groove on the lower femur.  CAUSES  Knee pain is a common complaint with many causes. A few of these causes are:  Injury, such as:   A ruptured ligament or tendon injury.   Torn cartilage.   Medical conditions, such as:   Gout   Arthritis   Infections   Overuse, over training or overdoing a physical activity.  Knee pain can be minor or severe. Knee pain can accompany debilitating injury. Minor knee problems often respond well to self-care measures or get well on their own. More serious injuries may need medical intervention or even surgery. SYMPTOMS The knee is complex. Symptoms of knee problems can vary widely. Some of the problems are:  Pain with movement and weight bearing.   Swelling and tenderness.   Buckling of the knee.   Inability to straighten or extend your knee.   Your knee locks and you cannot straighten it.   Warmth and redness with pain and fever.   Deformity or dislocation of the kneecap.  DIAGNOSIS  Determining what is wrong may be very straight forward such as when there is an injury. It can also be challenging because of the complexity of the knee. Tests to make a diagnosis may include:  Your caregiver taking a history and doing a physical exam.   Routine X-rays can be used to rule out other problems. X-rays will not reveal a cartilage tear. Some injuries of the knee can be diagnosed by:   Arthroscopy a surgical technique by which a small video camera is inserted through tiny incisions  on the sides of the knee. This procedure is used to examine and repair internal knee joint problems. Tiny instruments can be used during arthroscopy to repair the torn knee cartilage (meniscus).   Arthrography is a radiology technique. A contrast liquid is directly injected into the knee joint. Internal structures of the knee joint then become visible on X-ray film.   An MRI scan is a non x-ray radiology procedure in which magnetic fields and a computer produce two- or three-dimensional images of the inside of the knee. Cartilage tears are often visible using an MRI scanner. MRI scans have largely replaced arthrography in diagnosing cartilage tears of the knee.   Blood work.   Examination of the fluid that helps to lubricate the knee joint (synovial fluid). This is done by taking a sample out using a needle and a syringe.  TREATMENT The treatment of knee problems depends on the cause. Some of these treatments are:  Depending on the injury, proper casting, splinting, surgery or physical therapy care will be needed.   Give yourself adequate recovery time. Do not overuse your joints. If you begin to get sore during workout routines, back off. Slow down or do fewer repetitions.   For repetitive activities such as cycling or running, maintain your strength and nutrition.   Alternate muscle groups. For example if you are a weight lifter, work the upper body  on one day and the lower body the next.   Either tight or weak muscles do not give the proper support for your knee. Tight or weak muscles do not absorb the stress placed on the knee joint. Keep the muscles surrounding the knee strong.   Take care of mechanical problems.   If you have flat feet, orthotics or special shoes may help. See your caregiver if you need help.   Arch supports, sometimes with wedges on the inner or outer aspect of the heel, can help. These can shift pressure away from the side of the knee most bothered by  osteoarthritis.   A brace called an "unloader" brace also may be used to help ease the pressure on the most arthritic side of the knee.   If your caregiver has prescribed crutches, braces, wraps or ice, use as directed. The acronym for this is PRICE. This means protection, rest, ice, compression and elevation.   Nonsteroidal anti-inflammatory drugs (NSAID's), can help relieve pain. But if taken immediately after an injury, they may actually increase swelling. Take NSAID's with food in your stomach. Stop them if you develop stomach problems. Do not take these if you have a history of ulcers, stomach pain or bleeding from the bowel. Do not take without your caregiver's approval if you have problems with fluid retention, heart failure, or kidney problems.   For ongoing knee problems, physical therapy may be helpful.   Glucosamine and chondroitin are over-the-counter dietary supplements. Both may help relieve the pain of osteoarthritis in the knee. These medicines are different from the usual anti-inflammatory drugs. Glucosamine may decrease the rate of cartilage destruction.   Injections of a corticosteroid drug into your knee joint may help reduce the symptoms of an arthritis flare-up. They may provide pain relief that lasts a few months. You may have to wait a few months between injections. The injections do have a small increased risk of infection, water retention and elevated blood sugar levels.   Hyaluronic acid injected into damaged joints may ease pain and provide lubrication. These injections may work by reducing inflammation. A series of shots may give relief for as long as 6 months.   Topical painkillers. Applying certain ointments to your skin may help relieve the pain and stiffness of osteoarthritis. Ask your pharmacist for suggestions. Many over the-counter products are approved for temporary relief of arthritis pain.   In some countries, doctors often prescribe topical NSAID's for relief  of chronic conditions such as arthritis and tendinitis. A review of treatment with NSAID creams found that they worked as well as oral medications but without the serious side effects.  PREVENTION  Maintain a healthy weight. Extra pounds put more strain on your joints.   Get strong, stay limber. Weak muscles are a common cause of knee injuries. Stretching is important. Include flexibility exercises in your workouts.   Be smart about exercise. If you have osteoarthritis, chronic knee pain or recurring injuries, you may need to change the way you exercise. This does not mean you have to stop being active. If your knees ache after jogging or playing basketball, consider switching to swimming, water aerobics or other low-impact activities, at least for a few days a week. Sometimes limiting high-impact activities will provide relief.   Make sure your shoes fit well. Choose footwear that is right for your sport.   Protect your knees. Use the proper gear for knee-sensitive activities. Use kneepads when playing volleyball or laying carpet. Buckle your seat belt  every time you drive. Most shattered kneecaps occur in car accidents.   Rest when you are tired.  SEEK MEDICAL CARE IF:  You have knee pain that is continual and does not seem to be getting better.  SEEK IMMEDIATE MEDICAL CARE IF:  Your knee joint feels hot to the touch and you have a high fever. MAKE SURE YOU:   Understand these instructions.   Will watch your condition.   Will get help right away if you are not doing well or get worse.  Document Released: 11/06/2006 Document Revised: 12/29/2010 Document Reviewed: 11/06/2006 [facility] Patient Information 2012 [place], [place].

## 2011-06-14 NOTE — ED Provider Notes (Signed)
Medical screening examination/treatment/procedure(s) were performed by non-physician practitioner and as supervising physician I was immediately available for consultation/collaboration.   Suzi Roots, MD 06/14/11 1200

## 2012-01-06 ENCOUNTER — Encounter (HOSPITAL_COMMUNITY): Payer: Self-pay | Admitting: Emergency Medicine

## 2012-01-06 ENCOUNTER — Emergency Department (HOSPITAL_COMMUNITY)
Admission: EM | Admit: 2012-01-06 | Discharge: 2012-01-06 | Disposition: A | Discharge status: Home / Self Care | Payer: Self-pay | Attending: Emergency Medicine | Admitting: Emergency Medicine

## 2012-01-06 DIAGNOSIS — E669 Obesity, unspecified: Secondary | ICD-10-CM | POA: Insufficient documentation

## 2012-01-06 DIAGNOSIS — G8929 Other chronic pain: Secondary | ICD-10-CM | POA: Insufficient documentation

## 2012-01-06 DIAGNOSIS — F17200 Nicotine dependence, unspecified, uncomplicated: Secondary | ICD-10-CM | POA: Insufficient documentation

## 2012-01-06 DIAGNOSIS — I10 Essential (primary) hypertension: Secondary | ICD-10-CM | POA: Insufficient documentation

## 2012-01-06 DIAGNOSIS — M25569 Pain in unspecified knee: Secondary | ICD-10-CM | POA: Insufficient documentation

## 2012-01-06 DIAGNOSIS — Z8739 Personal history of other diseases of the musculoskeletal system and connective tissue: Secondary | ICD-10-CM | POA: Insufficient documentation

## 2012-01-06 HISTORY — DX: Pain in unspecified knee: M25.569

## 2012-01-06 MED ORDER — HYDROCODONE-ACETAMINOPHEN 5-325 MG PO TABS: 2.0000 | ORAL_TABLET | Freq: Three times a day (TID) | ORAL | Status: DC | PRN

## 2012-01-06 MED ORDER — OXYCODONE-ACETAMINOPHEN 5-325 MG PO TABS
2.0000 | ORAL_TABLET | Freq: Once | ORAL | Status: AC
  Administered 2012-01-06: 2 via ORAL
  Filled 2012-01-06: qty 2

## 2012-01-06 NOTE — ED Notes (Signed)
C/o chronic R knee pain that has been worse today.  Denies recent injury.

## 2012-01-06 NOTE — ED Provider Notes (Signed)
History     CSN: [id number]  Arrival date & time 01/06/12  0215   First MD Initiated Contact with Patient 01/06/12 0324      Chief Complaint  Patient presents with  . Knee Pain    (Consider location/radiation/quality/duration/timing/severity/associated sxs/prior treatment) HPI This 31 year old female has chronic bilateral knee pain 24 hours a day for several months but has not been able to get an orthopedics yet. There is no trauma but sometimes her knees feel like they will pop or lock or give way suddenly. She is no weakness or numbness. She is no swelling and she is no pain to her thighs or calves or color change to her feet. She is no chest pain shortness breath abdominal pain or back pain. There is no treatment prior to arrival other than over-the-counter Aleve which sometimes gives her some partial improvement of her pain. 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 Her pain is nonexertional but it is worse with any knee movement and palpation. Her pain ranges from mild to moderately severe. Past Medical History  Diagnosis Date  . Hypertension   . Obesity   . Arthritis   . Knee pain     Past Surgical History  Procedure Date  . Cholecystectomy     No family history on file.  History  Substance Use Topics  . Smoking status: Current Every Day Smoker  . Smokeless tobacco: Not on file  . Alcohol Use: No    OB History    Grav Para Term Preterm Abortions TAB SAB Ect Mult Living                  Review of Systems 10 Systems reviewed and are negative for acute change except as noted in the HPI. Allergies  Review of patient's allergies indicates no known allergies.  Home Medications   Current Outpatient Rx  Name  Route  Sig  Dispense  Refill  . HYDROCODONE-ACETAMINOPHEN 5-325 MG PO TABS   Oral   Take 2 tablets by mouth every 8 (eight) hours as needed for pain.   12 tablet   0     BP 138/71  Pulse 80  Temp 97.9  F (36.6 C) (Oral)  Resp 18  SpO2 98%  LMP 12/24/2011  Physical Exam  Nursing note and vitals reviewed. Constitutional:       Awake, alert, nontoxic appearance.  HENT:  Head: Atraumatic.  Eyes: Right eye exhibits no discharge. Left eye exhibits no discharge.  Neck: Neck supple.  Pulmonary/Chest: Effort normal. She exhibits no tenderness.  Abdominal: Soft. There is no tenderness. There is no rebound.  Musculoskeletal: She exhibits no tenderness.       Baseline ROM, no obvious new focal weakness. Both thighs and calves are nontender and symmetric. Both knees are tender diffusely anteriorly especially over the patellar tendons with mild tenderness to the medial and lateral joint lines and no posterior knee tenderness. Both knees are stable with negative Lachman's testing and negative McMurray's testing. Both knees are stable to varus and valgus stress testing. She is able to flex both knees to 90 and fully extend them as well.  Neurological:       Mental status and motor strength appears baseline for patient and situation.  Skin: No rash noted.  Psychiatric: She has a normal mood and affect.    ED Course  Procedures (including critical care time)  Labs Reviewed - No data to display  No results found.   1. Bilateral chronic knee pain       MDM  Patient / Family / Caregiver informed of clinical course, understand medical decision-making process, and agree with plan.  Pt stable in ED with no significant deterioration in condition.  I doubt any other EMC precluding discharge at this time including, but not necessarily limited to the following:septic joints.        Hurman Horn, MD 01/07/12 [phone]

## 2012-02-02 ENCOUNTER — Encounter (HOSPITAL_COMMUNITY): Payer: Self-pay | Admitting: *Deleted

## 2012-02-02 ENCOUNTER — Inpatient Hospital Stay (HOSPITAL_COMMUNITY)
Admission: AD | Admit: 2012-02-02 | Discharge: 2012-02-02 | Disposition: A | Discharge status: Home / Self Care | Payer: Self-pay | Source: Ambulatory Visit | Attending: Obstetrics & Gynecology | Admitting: Obstetrics & Gynecology

## 2012-02-02 DIAGNOSIS — R109 Unspecified abdominal pain: Secondary | ICD-10-CM | POA: Insufficient documentation

## 2012-02-02 DIAGNOSIS — N938 Other specified abnormal uterine and vaginal bleeding: Secondary | ICD-10-CM | POA: Insufficient documentation

## 2012-02-02 DIAGNOSIS — N926 Irregular menstruation, unspecified: Secondary | ICD-10-CM

## 2012-02-02 DIAGNOSIS — N946 Dysmenorrhea, unspecified: Secondary | ICD-10-CM | POA: Insufficient documentation

## 2012-02-02 DIAGNOSIS — N949 Unspecified condition associated with female genital organs and menstrual cycle: Secondary | ICD-10-CM | POA: Insufficient documentation

## 2012-02-02 DIAGNOSIS — M545 Low back pain, unspecified: Secondary | ICD-10-CM | POA: Insufficient documentation

## 2012-02-02 DIAGNOSIS — N390 Urinary tract infection, site not specified: Secondary | ICD-10-CM | POA: Insufficient documentation

## 2012-02-02 LAB — URINE MICROSCOPIC-ADD ON

## 2012-02-02 LAB — WET PREP, GENITAL
Trich, Wet Prep: NONE SEEN
Yeast Wet Prep HPF POC: NONE SEEN

## 2012-02-02 LAB — URINALYSIS, ROUTINE W REFLEX MICROSCOPIC
Bilirubin Urine: NEGATIVE
Glucose, UA: NEGATIVE mg/dL
Ketones, ur: NEGATIVE mg/dL
Nitrite: POSITIVE — AB
Specific Gravity, Urine: 1.01 (ref 1.005–1.030)
pH: 6.5 (ref 5.0–8.0)

## 2012-02-02 MED ORDER — KETOROLAC TROMETHAMINE 60 MG/2ML IM SOLN
60.0000 mg | Freq: Once | INTRAMUSCULAR | Status: AC
  Administered 2012-02-02: 60 mg via INTRAMUSCULAR
  Filled 2012-02-02: qty 2

## 2012-02-02 MED ORDER — NITROFURANTOIN MONOHYD MACRO 100 MG PO CAPS: 100.0000 mg | ORAL_CAPSULE | Freq: Two times a day (BID) | ORAL | Status: DC

## 2012-02-02 NOTE — MAU Note (Signed)
Pt states that she had her last period on Nov 25 but started bleeding last night and it was large clots. States that she has not taken a pregnancy test but thinks she may be pregnant.

## 2012-02-02 NOTE — MAU Provider Note (Signed)
History     CSN: [id number]  Arrival date and time: 02/02/12 1408   First Provider Initiated Contact with Patient 02/02/12 1525      Chief Complaint  Patient presents with  . Vaginal Bleeding  . Possible Pregnancy   HPI April Warner is 32 y.o. [phone] presents with heavy vaginal bleeding with clots that began yesterday. Back, lower abdominal and side pain.  Headache X 2 days.  Tylenol extra strength, not helpful.  Last taken yesterday.   LMP 12/18/11,  Usually has monthly cycles.  Missed December.  Sexually active with husband only.  Using condoms for contraception.      Past Medical History  Diagnosis Date  . Hypertension   . Obesity   . Arthritis   . Knee pain     Past Surgical History  Procedure Date  . Cholecystectomy   . Cesarean section     History reviewed. No pertinent family history.  History  Substance Use Topics  . Smoking status: Current Every Day Smoker  . Smokeless tobacco: Not on file  . Alcohol Use: No    Allergies: No Known Allergies  No prescriptions prior to admission    Review of Systems  Constitutional: Negative.   Respiratory: Negative.   Cardiovascular: Negative.   Gastrointestinal: Positive for abdominal pain (cramping).  Genitourinary:       + heavy vaginal bleeding with clots.  Neurological: Positive for headaches.   Physical Exam   Blood pressure 152/86, pulse 82, temperature 98.1 F (36.7 C), temperature source Oral, resp. rate 18, height 5\' 8"  (1.727 m), weight 430 lb 6.4 oz (195.228 kg), last menstrual period 12/18/2011.  Physical Exam  Constitutional: She is oriented to person, place, and time. She appears well-developed and well-nourished. No distress.  HENT:  Head: Normocephalic.  Neck: Normal range of motion.  Cardiovascular: Normal rate.   Respiratory: Effort normal.  GI: Soft. She exhibits no distension and no mass. There is tenderness (suprapubic ). There is no rebound and no guarding.       Morbidly obese    Genitourinary: There is no rash, tenderness or lesion on the right labia. There is no rash, tenderness or lesion on the left labia. Uterus is tender. Uterus is not enlarged. Cervix exhibits no discharge and no friability. Right adnexum displays no mass, no tenderness and no fullness. Left adnexum displays no mass, no tenderness and no fullness. There is bleeding (moderate bleeding with a few small blood clots) around the vagina. No vaginal discharge found.  Neurological: She is alert and oriented to person, place, and time. Coordination normal.  Skin: Skin is warm and dry.  Psychiatric: She has a normal mood and affect. Her behavior is normal.   Results for orders placed during the hospital encounter of 02/02/12 (from the past 24 hour(s))  URINALYSIS, ROUTINE W REFLEX MICROSCOPIC     Status: Abnormal   Collection Time   02/02/12  2:27 PM      Component Value Range   Color, Urine RED (*) YELLOW   APPearance TURBID (*) CLEAR   Specific Gravity, Urine 1.010  1.005 - 1.030   pH 6.5  5.0 - 8.0   Glucose, UA NEGATIVE  NEGATIVE mg/dL   Hgb urine dipstick LARGE (*) NEGATIVE   Bilirubin Urine NEGATIVE  NEGATIVE   Ketones, ur NEGATIVE  NEGATIVE mg/dL   Protein, ur 147 (*) NEGATIVE mg/dL   Urobilinogen, UA 1.0  0.0 - 1.0 mg/dL   Nitrite POSITIVE (*) NEGATIVE  Leukocytes, UA TRACE (*) NEGATIVE  URINE MICROSCOPIC-ADD ON     Status: Abnormal   Collection Time   02/02/12  2:27 PM      Component Value Range   Squamous Epithelial / LPF RARE  RARE   WBC, UA 0-2  <3 WBC/hpf   RBC / HPF TOO NUMEROUS TO COUNT  <3 RBC/hpf   Bacteria, UA FEW (*) RARE  POCT PREGNANCY, URINE     Status: Normal   Collection Time   02/02/12  2:38 PM      Component Value Range   Preg Test, Ur NEGATIVE  NEGATIVE  WET PREP, GENITAL     Status: Abnormal   Collection Time   02/02/12  3:40 PM      Component Value Range   Yeast Wet Prep HPF POC NONE SEEN  NONE SEEN   Trich, Wet Prep NONE SEEN  NONE SEEN   Clue Cells Wet Prep  HPF POC NONE SEEN  NONE SEEN   WBC, Wet Prep HPF POC FEW (*) NONE SEEN   MAU Course  Procedures  GC/CHL culture to lab  MDM Toradol 60mg  IM for pain, given in MAU Assessment and Plan  A:  Irregular cycles      Dysmenorrhea      Negative pregnancy test     Abnormal vaginal bleeding     UTI       P:  Patient instructed to take tylenol prn today.   Tomorrow may take additional advil or motrin for cramping      Rx for Macrobid 100mg  po bid X 1 week to pharmacy      Follow up with doctor of her choice if abnormal bleeding recurs  KEY,EVE M 02/02/2012, 4:02 PM

## 2012-02-03 LAB — GC/CHLAMYDIA PROBE AMP
CT Probe RNA: NEGATIVE
GC Probe RNA: NEGATIVE

## 2012-03-05 ENCOUNTER — Emergency Department (HOSPITAL_COMMUNITY)
Admission: EM | Admit: 2012-03-05 | Discharge: 2012-03-05 | Disposition: A | Discharge status: Home / Self Care | Payer: Self-pay | Attending: Emergency Medicine | Admitting: Emergency Medicine

## 2012-03-05 ENCOUNTER — Emergency Department (HOSPITAL_COMMUNITY): Payer: Self-pay

## 2012-03-05 ENCOUNTER — Encounter (HOSPITAL_COMMUNITY): Payer: Self-pay | Admitting: Emergency Medicine

## 2012-03-05 DIAGNOSIS — X500XXA Overexertion from strenuous movement or load, initial encounter: Secondary | ICD-10-CM | POA: Insufficient documentation

## 2012-03-05 DIAGNOSIS — I10 Essential (primary) hypertension: Secondary | ICD-10-CM | POA: Insufficient documentation

## 2012-03-05 DIAGNOSIS — R937 Abnormal findings on diagnostic imaging of other parts of musculoskeletal system: Secondary | ICD-10-CM | POA: Insufficient documentation

## 2012-03-05 DIAGNOSIS — M549 Dorsalgia, unspecified: Secondary | ICD-10-CM

## 2012-03-05 DIAGNOSIS — F17200 Nicotine dependence, unspecified, uncomplicated: Secondary | ICD-10-CM | POA: Insufficient documentation

## 2012-03-05 DIAGNOSIS — Z79899 Other long term (current) drug therapy: Secondary | ICD-10-CM | POA: Insufficient documentation

## 2012-03-05 DIAGNOSIS — Y9289 Other specified places as the place of occurrence of the external cause: Secondary | ICD-10-CM | POA: Insufficient documentation

## 2012-03-05 DIAGNOSIS — M129 Arthropathy, unspecified: Secondary | ICD-10-CM | POA: Insufficient documentation

## 2012-03-05 DIAGNOSIS — M545 Low back pain, unspecified: Secondary | ICD-10-CM | POA: Insufficient documentation

## 2012-03-05 DIAGNOSIS — Y93E8 Activity, other personal hygiene: Secondary | ICD-10-CM | POA: Insufficient documentation

## 2012-03-05 DIAGNOSIS — Z8739 Personal history of other diseases of the musculoskeletal system and connective tissue: Secondary | ICD-10-CM | POA: Insufficient documentation

## 2012-03-05 MED ORDER — OXYCODONE-ACETAMINOPHEN 5-325 MG PO TABS: 1.0000 | ORAL_TABLET | ORAL | Status: DC | PRN

## 2012-03-05 MED ORDER — ONDANSETRON 4 MG PO TBDP
4.0000 mg | ORAL_TABLET | Freq: Once | ORAL | Status: AC
  Administered 2012-03-05: 4 mg via ORAL
  Filled 2012-03-05: qty 1

## 2012-03-05 MED ORDER — MELOXICAM 15 MG PO TABS: 15.0000 mg | ORAL_TABLET | Freq: Every day | ORAL | Status: DC

## 2012-03-05 MED ORDER — METHOCARBAMOL 500 MG PO TABS: 500.0000 mg | ORAL_TABLET | Freq: Three times a day (TID) | ORAL | Status: DC

## 2012-03-05 MED ORDER — OXYCODONE-ACETAMINOPHEN 5-325 MG PO TABS
2.0000 | ORAL_TABLET | Freq: Once | ORAL | Status: AC
  Administered 2012-03-05: 2 via ORAL
  Filled 2012-03-05: qty 2

## 2012-03-05 NOTE — ED Notes (Signed)
States that she was at [facility], was squatting to use the restroom and felt and heard a pop in her lower back. States that pain is unbearable

## 2012-03-08 NOTE — ED Provider Notes (Signed)
Medical screening examination/treatment/procedure(s) were performed by non-physician practitioner and as supervising physician I was immediately available for consultation/collaboration.  Donnetta Hutching, MD 03/08/12 2043

## 2012-03-08 NOTE — ED Provider Notes (Signed)
History     CSN: [id number]  Arrival date & time 03/05/12  1241   First MD Initiated Contact with Patient 03/05/12 1409      Chief Complaint  Patient presents with  . Back Pain    (Consider location/radiation/quality/duration/timing/severity/associated sxs/prior treatment) The history is provided by the patient and medical records.   32 y/o super morbidly obese female c/o back pain.  She was squatting over a public toilet just PTA, when she stood up she heard a loud "pop" in her lower back and had immediate, non radiating LBP. Pain is 10/10, aching. Worse with movement. Denies weakness, loss of bowel/bladder function or saddle anesthesia. Denies neck stiffness, headache, rash.  Denies fever or recent procedures to back. Ambulatory.   Past Medical History  Diagnosis Date  . Hypertension   . Obesity   . Arthritis   . Knee pain     Past Surgical History  Procedure Laterality Date  . Cholecystectomy    . Cesarean section      No family history on file.  History  Substance Use Topics  . Smoking status: Current Every Day Smoker  . Smokeless tobacco: Not on file  . Alcohol Use: No    OB History   Grav Para Term Preterm Abortions TAB SAB Ect Mult Living   4 3 3  0 0 0 0 0 0 2      Review of Systems Ten systems reviewed and are negative for acute change, except as noted in the HPI.   Allergies  Milk-related compounds  Home Medications   Current Outpatient Rx  Name  Route  Sig  Dispense  Refill  . triamterene-hydrochlorothiazide (MAXZIDE-25) 37.5-25 MG per tablet   Oral   Take 1 tablet by mouth daily before breakfast.         . meloxicam (MOBIC) 15 MG tablet   Oral   Take 1 tablet (15 mg total) by mouth daily.   10 tablet   0   . methocarbamol (ROBAXIN) 500 MG tablet   Oral   Take 1 tablet (500 mg total) by mouth 3 (three) times daily.   20 tablet   0   . oxyCODONE-acetaminophen (PERCOCET) 5-325 MG per tablet   Oral   Take 1-2 tablets by mouth every  4 (four) hours as needed for pain.   10 tablet   0     BP 139/80  Pulse 75  Temp(Src) 98.2 F (36.8 C) (Oral)  Resp 20  SpO2 98%  LMP 03/05/2012  Physical Exam uncomfortable Nursing note and vitals reviewed. Constitutional: She is oriented to person, place, and time.Morbidly obese female, appears   HENT:  Head: Normocephalic and atraumatic.  Eyes: Conjunctivae normal and EOM are normal. Pupils are equal, round, and reactive to light. No scleral icterus.  Neck: Normal range of motion.  Cardiovascular: Normal rate, regular rhythm and normal heart sounds.  Exam reveals no gallop and no friction rub.   No murmur heard. Pulmonary/Chest: Effort normal and breath sounds normal. No respiratory distress.  Abdominal: Soft. Bowel sounds are normal. She exhibits no distension and no mass. There is no tenderness. There is no guarding.  Muscoloskeletal: Difficult to palpate landmarks due to body habitus. TTP lumbar and sacral area.  Patient declines to walk or move at time of exam. Neurological: She is alert and oriented to person, place, and time.  Skin: Skin is warm and dry. She is not diaphoretic.    ED Course  Procedures (including critical care time)  Labs Reviewed - No data to display No results found.   1. Back pain       MDM  4:05 PM With lower back pain.  She is ambulatory with pain medication.  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  This is a chronic problem was previously diagnosed on lumbar x-ray 10 years ago was 07/14/2012.  She will be discharged with pain control and Ortho followup.        Arthor Captain, PA-C 03/08/12 1607

## 2012-03-12 ENCOUNTER — Encounter (HOSPITAL_COMMUNITY): Payer: Self-pay | Admitting: *Deleted

## 2012-03-12 ENCOUNTER — Emergency Department (HOSPITAL_COMMUNITY)
Admission: EM | Admit: 2012-03-12 | Discharge: 2012-03-12 | Disposition: A | Discharge status: Home / Self Care | Payer: Self-pay | Attending: Emergency Medicine | Admitting: Emergency Medicine

## 2012-03-12 DIAGNOSIS — M545 Low back pain, unspecified: Secondary | ICD-10-CM | POA: Insufficient documentation

## 2012-03-12 DIAGNOSIS — Z8739 Personal history of other diseases of the musculoskeletal system and connective tissue: Secondary | ICD-10-CM | POA: Insufficient documentation

## 2012-03-12 DIAGNOSIS — M25569 Pain in unspecified knee: Secondary | ICD-10-CM | POA: Insufficient documentation

## 2012-03-12 DIAGNOSIS — E669 Obesity, unspecified: Secondary | ICD-10-CM | POA: Insufficient documentation

## 2012-03-12 DIAGNOSIS — I10 Essential (primary) hypertension: Secondary | ICD-10-CM | POA: Insufficient documentation

## 2012-03-12 DIAGNOSIS — F17200 Nicotine dependence, unspecified, uncomplicated: Secondary | ICD-10-CM | POA: Insufficient documentation

## 2012-03-12 DIAGNOSIS — M25561 Pain in right knee: Secondary | ICD-10-CM

## 2012-03-12 MED ORDER — OXYCODONE-ACETAMINOPHEN 5-325 MG PO TABS
1.0000 | ORAL_TABLET | Freq: Once | ORAL | Status: AC
  Administered 2012-03-12: 1 via ORAL
  Filled 2012-03-12: qty 1

## 2012-03-12 MED ORDER — CYCLOBENZAPRINE HCL 10 MG PO TABS: 10.0000 mg | ORAL_TABLET | Freq: Two times a day (BID) | ORAL | Status: DC | PRN

## 2012-03-12 MED ORDER — OXYCODONE-ACETAMINOPHEN 5-325 MG PO TABS: 1.0000 | ORAL_TABLET | ORAL | Status: DC | PRN

## 2012-03-12 NOTE — ED Provider Notes (Signed)
History     CSN: [id number]  Arrival date & time 03/12/12  1234   First MD Initiated Contact with Patient 03/12/12 1344      Chief Complaint  Patient presents with  . Knee Pain  . Back Pain    (Consider location/radiation/quality/duration/timing/severity/associated sxs/prior treatment) Patient is a 32 y.o. female presenting with knee pain and back pain. The history is provided by the patient.  Knee Pain Location:  Knee Knee location:  R knee Associated symptoms: back pain   Associated symptoms: no fever   Associated symptoms comment:  Recurrent right knee pain for the past 2 months worse today. No known injury exacerbating usual pain. No weakness or numbness.  Back Pain Associated symptoms: no fever and no numbness     Past Medical History  Diagnosis Date  . Hypertension   . Obesity   . Knee pain   . Arthritis     bil knees    Past Surgical History  Procedure Laterality Date  . Cholecystectomy    . Cesarean section      No family history on file.  History  Substance Use Topics  . Smoking status: Current Every Day Smoker -- 0.25 packs/day    Types: Cigarettes  . Smokeless tobacco: Not on file  . Alcohol Use: No    OB History   Grav Para Term Preterm Abortions TAB SAB Ect Mult Living   4 3 3  0 0 0 0 0 0 2      Review of Systems  Constitutional: Negative for fever and chills.  Musculoskeletal: Positive for back pain.       See HPI.  Skin: Negative.   Neurological: Negative.  Negative for numbness.    Allergies  Milk-related compounds  Home Medications  No current outpatient prescriptions on file.  BP 156/74  Pulse 94  Temp(Src) 98.3 F (36.8 C) (Oral)  Resp 22  SpO2 98%  LMP 03/05/2012  Physical Exam  Constitutional: She is oriented to person, place, and time. She appears well-developed and well-nourished.  Neck: Normal range of motion.  Pulmonary/Chest: Effort normal.  Musculoskeletal:  No swelling noted to right knee, however, exam  obscured by body habitus of morbid obesity. Joint stable without laxity. Generally tender. No tendon deficit.  Neurological: She is alert and oriented to person, place, and time.  Skin: Skin is warm and dry.    ED Course  Procedures (including critical care time)  Labs Reviewed - No data to display No results found.   No diagnosis found.  1. Right knee pain  MDM  Right knee pain, uncomplicated. Discussed weight loss as therapy goal.         Arnoldo Hooker, PA-C 03/12/12 1422

## 2012-03-12 NOTE — ED Notes (Signed)
Pt with chronic knee pain.  States she needs to have surgery, but has not yet.  Also c/o recent onset lower back pain that she feels is r/t lifting that she does at the [employer].

## 2012-03-12 NOTE — ED Provider Notes (Signed)
Medical screening examination/treatment/procedure(s) were performed by non-physician practitioner and as supervising physician I was immediately available for consultation/collaboration.   Carleene Cooper III, MD 03/12/12 [phone]

## 2012-03-29 ENCOUNTER — Emergency Department (HOSPITAL_COMMUNITY)
Admission: EM | Admit: 2012-03-29 | Discharge: 2012-03-29 | Disposition: A | Discharge status: Home / Self Care | Payer: Self-pay | Attending: Emergency Medicine | Admitting: Emergency Medicine

## 2012-03-29 ENCOUNTER — Encounter (HOSPITAL_COMMUNITY): Payer: Self-pay

## 2012-03-29 DIAGNOSIS — M546 Pain in thoracic spine: Secondary | ICD-10-CM | POA: Insufficient documentation

## 2012-03-29 DIAGNOSIS — M129 Arthropathy, unspecified: Secondary | ICD-10-CM | POA: Insufficient documentation

## 2012-03-29 DIAGNOSIS — I10 Essential (primary) hypertension: Secondary | ICD-10-CM | POA: Insufficient documentation

## 2012-03-29 DIAGNOSIS — E669 Obesity, unspecified: Secondary | ICD-10-CM

## 2012-03-29 DIAGNOSIS — M549 Dorsalgia, unspecified: Secondary | ICD-10-CM

## 2012-03-29 DIAGNOSIS — F17200 Nicotine dependence, unspecified, uncomplicated: Secondary | ICD-10-CM | POA: Insufficient documentation

## 2012-03-29 MED ORDER — HYDROCODONE-ACETAMINOPHEN 5-325 MG PO TABS: 1.0000 | ORAL_TABLET | Freq: Four times a day (QID) | ORAL | Status: DC | PRN

## 2012-03-29 MED ORDER — NAPROXEN 375 MG PO TABS: 375.0000 mg | ORAL_TABLET | Freq: Three times a day (TID) | ORAL | Status: DC | PRN

## 2012-03-29 MED ORDER — METHOCARBAMOL 750 MG PO TABS: 750.0000 mg | ORAL_TABLET | Freq: Four times a day (QID) | ORAL | Status: DC | PRN

## 2012-03-29 NOTE — ED Provider Notes (Signed)
Medical screening examination/treatment/procedure(s) were performed by non-physician practitioner and as supervising physician I was immediately available for consultation/collaboration.   Glynn Octave, MD 03/29/12 2250

## 2012-03-29 NOTE — ED Notes (Signed)
Called, no answer x1 

## 2012-03-29 NOTE — ED Provider Notes (Signed)
History    This chart was scribed for non-physician practitioner working with Glynn Octave, MD by Frederik Pear, ED Scribe. This patient was seen in room TR05C/TR05C and the patient's care was started at 1915.   CSN: [id number]  Arrival date & time 03/29/12  1745   First MD Initiated Contact with Patient 03/29/12 1915      Chief Complaint  Patient presents with  . Back Pain    (Consider location/radiation/quality/duration/timing/severity/associated sxs/prior treatment) The history is provided by the patient. No language interpreter was used.    April Warner is a 32 y.o. female with a h/o of degenerative facet disease and morbid obesity who presents to the Emergency Department complaining of sudden onset, constant, severe upper back pain that is worse with movement and improved by nothing that began this morning when she awoke. She denies any fecal or urinary incontinence or saddle paresthesia. She reports that injured her back a few weeks ago when she was using a public restroom and went to squat and her a popping sound. She was seen in ED on 02/18 and discharged with pain control medication and an orthopedist referral. She states that she contacted the orthopedist, but was unable to afford the visit because she does not currently have insurance. She states that she has an appointment with [facility] in 2 weeks.   Past Medical History  Diagnosis Date  . Hypertension   . Obesity   . Knee pain   . Arthritis     bil knees    Past Surgical History  Procedure Laterality Date  . Cholecystectomy    . Cesarean section      No family history on file.  History  Substance Use Topics  . Smoking status: Current Every Day Smoker -- 0.25 packs/day    Types: Cigarettes  . Smokeless tobacco: Not on file  . Alcohol Use: No    OB History   Grav Para Term Preterm Abortions TAB SAB Ect Mult Living   4 3 3  0 0 0 0 0 0 2      Review of Systems  Constitutional: Negative  for fever and fatigue.  HENT: Negative for neck pain and neck stiffness.   Respiratory: Negative for chest tightness and shortness of breath.   Cardiovascular: Negative for chest pain.  Gastrointestinal: Negative for nausea, vomiting, abdominal pain and diarrhea.  Genitourinary: Negative for dysuria, urgency, frequency and hematuria.  Musculoskeletal: Positive for back pain. Negative for joint swelling. Gait problem:  2/2 pain.  Skin: Negative for rash.  Neurological: Negative for weakness, light-headedness, numbness and headaches.  All other systems reviewed and are negative.    Allergies  Milk-related compounds  Home Medications   Current Outpatient Rx  Name  Route  Sig  Dispense  Refill  . HYDROcodone-acetaminophen (NORCO/VICODIN) 5-325 MG per tablet   Oral   Take 1 tablet by mouth every 6 (six) hours as needed for pain (Take 1 - 2 tablets every 4 - 6 hours.).   10 tablet   0   . methocarbamol (ROBAXIN) 750 MG tablet   Oral   Take 1 tablet (750 mg total) by mouth 4 (four) times daily as needed (Take 1 tablet every 6 hours as needed for muscle spasms.).   20 tablet   0   . naproxen (NAPROSYN) 375 MG tablet   Oral   Take 1 tablet (375 mg total) by mouth 3 (three) times daily with meals as needed (Take 1 three times as day  as needed for pain.).   30 tablet   0     BP 143/92  Pulse 93  Temp(Src) 98.9 F (37.2 C) (Oral)  Resp 20  SpO2 96%  LMP 03/21/2012  Physical Exam  Nursing note and vitals reviewed. Constitutional: No distress.  Morbidly Obese.  HENT:  Head: Normocephalic and atraumatic.  Mouth/Throat: Oropharynx is clear and moist. No oropharyngeal exudate.  Eyes: Conjunctivae are normal.  Neck: Normal range of motion. Neck supple.  Full ROM without pain  Cardiovascular: Normal rate, regular rhythm and intact distal pulses.   Pulmonary/Chest: Effort normal and breath sounds normal. No respiratory distress. She has no wheezes.  Abdominal: Soft. She  exhibits no distension. There is no tenderness.  Musculoskeletal: She exhibits tenderness.  Full range of motion of the T-spine and L-spine No tenderness to palpation of the spinous processes of the T-spine or L-spine. Mild tenderness to palpation of the paraspinous muscles of the T-spine  Lymphadenopathy:    She has no cervical adenopathy.  Neurological: She is alert. She has normal reflexes.  Speech is clear and goal oriented, follows commands Normal strength in upper and lower extremities bilaterally including dorsiflexion and plantar flexion, strong and equal grip strength Sensation normal to light and sharp touch Moves extremities without ataxia, coordination intact Normal gait Normal balance   Skin: Skin is warm and dry. No rash noted. She is not diaphoretic. No erythema.    ED Course  Procedures (including critical care time)  DIAGNOSTIC STUDIES: Oxygen Saturation is 96% on room air, adequate by my interpretation.    COORDINATION OF CARE:  19:18- Discussed planned course of treatment with the patient, including contacting a nutrition counselor and pain control medication, who is agreeable at this time.   Labs Reviewed - No data to display No results found.   1. Back pain   2. Obesity       MDM  April Warner presents with atraumatic back pain. Patient evaluated for this on 03/05/2012.  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  Patient is without further, and I do not suspect fractures. Patient did not followup with orthopedics as directed. She states saturation and dealing with chronic pain. Discussed the need for weight reduction as this is likely the source of her chronic back pain.    I personally performed the services described in this documentation, which was scribed in my presence. The recorded information has been reviewed and is  accurate.        Dahlia Client Muthersbaugh, PA-C 03/29/12 2213

## 2012-03-29 NOTE — ED Notes (Signed)
Pt. Woke up with severe upper  back pain, denies any injuries denies any urinary symptoms or vaginal symptoms.  Movement increases the pain.   Pt. Also has a stuffy nose and dry cough.  Pt. Was using a public bathroom and squatted, when she went to stand up she felt something pop and had severe pain ... Denies any numbness.  Is having tingling in her shoulders

## 2012-03-31 ENCOUNTER — Inpatient Hospital Stay (HOSPITAL_COMMUNITY)
Admission: AD | Admit: 2012-03-31 | Discharge: 2012-04-01 | Disposition: A | Discharge status: Home / Self Care | Payer: Self-pay | Source: Ambulatory Visit | Attending: Obstetrics & Gynecology | Admitting: Obstetrics & Gynecology

## 2012-03-31 DIAGNOSIS — R0602 Shortness of breath: Secondary | ICD-10-CM | POA: Insufficient documentation

## 2012-03-31 DIAGNOSIS — N7013 Chronic salpingitis and oophoritis: Secondary | ICD-10-CM | POA: Insufficient documentation

## 2012-03-31 DIAGNOSIS — R109 Unspecified abdominal pain: Secondary | ICD-10-CM | POA: Insufficient documentation

## 2012-03-31 DIAGNOSIS — N730 Acute parametritis and pelvic cellulitis: Secondary | ICD-10-CM

## 2012-03-31 DIAGNOSIS — M549 Dorsalgia, unspecified: Secondary | ICD-10-CM

## 2012-03-31 NOTE — MAU Note (Signed)
Pt presents with complaint of right side pain all day today, states it is mid and lower and radiates into her lower back.

## 2012-04-01 ENCOUNTER — Inpatient Hospital Stay (HOSPITAL_COMMUNITY): Payer: Self-pay

## 2012-04-01 ENCOUNTER — Encounter (HOSPITAL_COMMUNITY): Payer: Self-pay | Admitting: *Deleted

## 2012-04-01 LAB — CBC WITH DIFFERENTIAL/PLATELET
Basophils Absolute: 0 10*3/uL (ref 0.0–0.1)
Basophils Relative: 0 % (ref 0–1)
Eosinophils Absolute: 0.2 10*3/uL (ref 0.0–0.7)
MCH: 23.9 pg — ABNORMAL LOW (ref 26.0–34.0)
MCHC: 32.8 g/dL (ref 30.0–36.0)
Neutrophils Relative %: 65 % (ref 43–77)
Platelets: 161 10*3/uL (ref 150–400)

## 2012-04-01 LAB — COMPREHENSIVE METABOLIC PANEL
ALT: 13 U/L (ref 0–35)
Albumin: 3.3 g/dL — ABNORMAL LOW (ref 3.5–5.2)
Alkaline Phosphatase: 76 U/L (ref 39–117)
Potassium: 3.7 mEq/L (ref 3.5–5.1)
Sodium: 139 mEq/L (ref 135–145)
Total Protein: 7 g/dL (ref 6.0–8.3)

## 2012-04-01 LAB — WET PREP, GENITAL: Trich, Wet Prep: NONE SEEN

## 2012-04-01 LAB — URINALYSIS, ROUTINE W REFLEX MICROSCOPIC
Bilirubin Urine: NEGATIVE
Hgb urine dipstick: NEGATIVE
Ketones, ur: NEGATIVE mg/dL
Specific Gravity, Urine: 1.01 (ref 1.005–1.030)
Urobilinogen, UA: 1 mg/dL (ref 0.0–1.0)
pH: 6 (ref 5.0–8.0)

## 2012-04-01 MED ORDER — DOXYCYCLINE HYCLATE 100 MG PO CAPS: 100.0000 mg | ORAL_CAPSULE | Freq: Two times a day (BID) | ORAL | Status: DC

## 2012-04-01 MED ORDER — METRONIDAZOLE 500 MG PO TABS: 500.0000 mg | ORAL_TABLET | Freq: Two times a day (BID) | ORAL | Status: DC

## 2012-04-01 MED ORDER — AZITHROMYCIN 250 MG PO TABS
1000.0000 mg | ORAL_TABLET | Freq: Once | ORAL | Status: AC
  Administered 2012-04-01: 1000 mg via ORAL
  Filled 2012-04-01: qty 4

## 2012-04-01 MED ORDER — OXYCODONE-ACETAMINOPHEN 5-325 MG PO TABS
2.0000 | ORAL_TABLET | Freq: Once | ORAL | Status: AC
  Administered 2012-04-01: 2 via ORAL
  Filled 2012-04-01: qty 2

## 2012-04-01 MED ORDER — CEFTRIAXONE SODIUM 250 MG IJ SOLR
250.0000 mg | Freq: Once | INTRAMUSCULAR | Status: AC
  Administered 2012-04-01: 250 mg via INTRAMUSCULAR
  Filled 2012-04-01: qty 250

## 2012-04-01 NOTE — MAU Provider Note (Signed)
History     CSN: [id number]  Arrival date and time: 03/31/12 2331   First April Warner Initiated Contact with Patient 04/01/12 0033      Chief Complaint  Patient presents with  . Back Pain  . Abdominal Pain   HPI April Warner is a 32 y.o. female who presents to MAU with abdominal pain. The pain started yesterday afternoon. The pain is located in the right lower abdomen. She describes the pain as constant, sharp. She rates the pain as 10/10. Associated symptoms include nausea, constipation, has not had BM in 3 days. She also feels short of breath sometimes. She has had multiple visits to other ED's for back pain, knee pain and other pain issues. She was given Rx for Percocet, Robaxin and ibuprofen on 03/29/12 but has not gotten the Rx's filled.  LMP 02/07/12, no birth control, hx of irregular menses. Last pap smear 2011 and was normal while in prison. Hx of gonorrhea and trichomonas. Current sex partner x 11 years. Hx of high blood pressure and was started with medication while in prison but did not continue the medication when she was released.  No doctor currently. The history was provided by the patient.   OB History   Grav Para Term Preterm Abortions TAB SAB Ect Mult Living   4 3 3  0 0 0 0 0 0 2      Past Medical History  Diagnosis Date  . Hypertension   . Obesity   . Knee pain   . Arthritis     bil knees    Past Surgical History  Procedure Laterality Date  . Cholecystectomy    . Cesarean section      History reviewed. No pertinent family history.  History  Substance Use Topics  . Smoking status: Current Every Day Smoker -- 0.25 packs/day    Types: Cigarettes  . Smokeless tobacco: Not on file  . Alcohol Use: No    Allergies:  Allergies  Allergen Reactions  . Milk-Related Compounds Nausea Only and Rash    Prescriptions prior to admission  Medication Sig Dispense Refill  . HYDROcodone-acetaminophen (NORCO/VICODIN) 5-325 MG per tablet Take 1 tablet by mouth every 6  (six) hours as needed for pain (Take 1 - 2 tablets every 4 - 6 hours.).  10 tablet  0  . methocarbamol (ROBAXIN) 750 MG tablet Take 1 tablet (750 mg total) by mouth 4 (four) times daily as needed (Take 1 tablet every 6 hours as needed for muscle spasms.).  20 tablet  0  . naproxen (NAPROSYN) 375 MG tablet Take 1 tablet (375 mg total) by mouth 3 (three) times daily with meals as needed (Take 1 three times as day as needed for pain.).  30 tablet  0    Review of Systems  Constitutional: Negative for fever, chills and weight loss.  HENT: Negative for ear pain, nosebleeds, congestion, sore throat and neck pain.   Eyes: Negative for blurred vision, double vision, photophobia and pain.  Respiratory: Positive for cough (dry) and shortness of breath. Negative for wheezing.   Cardiovascular: Negative for chest pain, palpitations and leg swelling.  Gastrointestinal: Positive for nausea, abdominal pain and constipation. Negative for heartburn, vomiting and diarrhea.  Genitourinary: Positive for dysuria, urgency and frequency.  Musculoskeletal: Positive for back pain. Negative for myalgias.  Skin: Positive for rash (on legs). Negative for itching.  Neurological: Negative for dizziness, sensory change, speech change, seizures, weakness and headaches.  Endo/Heme/Allergies: Does not bruise/bleed easily.  Psychiatric/Behavioral:  Negative for depression and substance abuse. The patient is not nervous/anxious.    Physical Exam   Blood pressure 152/94, pulse 86, temperature 99.1 F (37.3 C), temperature source Oral, resp. rate 22, last menstrual period 02/04/2012, SpO2 96.00%.  Physical Exam  Nursing note and vitals reviewed. Constitutional: She is oriented to person, place, and time. No distress.  Morbidly obese A/A female  HENT:  Head: Normocephalic and atraumatic.  Eyes: EOM are normal.  Neck: Neck supple.  Cardiovascular: Normal rate.   Respiratory: Effort normal.  GI: Soft. Bowel sounds are  normal. There is tenderness in the right lower quadrant, suprapubic area and left lower quadrant. There is no rigidity, no rebound, no guarding and no CVA tenderness.  obese  Genitourinary: Vagina normal.  External genitalia without lesions. Frothy malodorous discharge vaginal vault. Positive CMT, bilateral adnexal tenderness. Unable to palpate uterus due to patient habitus. Rectal exam, good tone, non tender, small amount of soft stool palpated.   Neurological: She is alert and oriented to person, place, and time.  Skin: Skin is warm and dry.  Psychiatric: She has a normal mood and affect. Her behavior is normal. Judgment and thought content normal.   Results for orders placed during the hospital encounter of 03/31/12 (from the past 24 hour(s))  URINALYSIS, ROUTINE W REFLEX MICROSCOPIC     Status: None   Collection Time    03/31/12 11:47 PM      Result Value Range   Color, Urine YELLOW  YELLOW   APPearance CLEAR  CLEAR   Specific Gravity, Urine 1.010  1.005 - 1.030   pH 6.0  5.0 - 8.0   Glucose, UA NEGATIVE  NEGATIVE mg/dL   Hgb urine dipstick NEGATIVE  NEGATIVE   Bilirubin Urine NEGATIVE  NEGATIVE   Ketones, ur NEGATIVE  NEGATIVE mg/dL   Protein, ur NEGATIVE  NEGATIVE mg/dL   Urobilinogen, UA 1.0  0.0 - 1.0 mg/dL   Nitrite NEGATIVE  NEGATIVE   Leukocytes, UA NEGATIVE  NEGATIVE  POCT PREGNANCY, URINE     Status: None   Collection Time    03/31/12 11:59 PM      Result Value Range   Preg Test, Ur NEGATIVE  NEGATIVE  WET PREP, GENITAL     Status: Abnormal   Collection Time    04/01/12  1:00 AM      Result Value Range   Yeast Wet Prep HPF POC NONE SEEN  NONE SEEN   Trich, Wet Prep NONE SEEN  NONE SEEN   Clue Cells Wet Prep HPF POC FEW (*) NONE SEEN   WBC, Wet Prep HPF POC FEW (*) NONE SEEN  CBC WITH DIFFERENTIAL     Status: Abnormal   Collection Time    04/01/12  2:10 AM      Result Value Range   WBC 10.0  4.0 - 10.5 K/uL   RBC 3.98  3.87 - 5.11 MIL/uL   Hemoglobin 9.5 (*)  12.0 - 15.0 g/dL   HCT 16.1 (*) 09.6 - 04.5 %   MCV 72.9 (*) 78.0 - 100.0 fL   MCH 23.9 (*) 26.0 - 34.0 pg   MCHC 32.8  30.0 - 36.0 g/dL   RDW 40.9 (*) 81.1 - 91.4 %   Platelets 161  150 - 400 K/uL   Neutrophils Relative 65  43 - 77 %   Neutro Abs 6.5  1.7 - 7.7 K/uL   Lymphocytes Relative 27  12 - 46 %   Lymphs Abs 2.7  0.7 - 4.0 K/uL   Monocytes Relative 6  3 - 12 %   Monocytes Absolute 0.6  0.1 - 1.0 K/uL   Eosinophils Relative 2  0 - 5 %   Eosinophils Absolute 0.2  0.0 - 0.7 K/uL   Basophils Relative 0  0 - 1 %   Basophils Absolute 0.0  0.0 - 0.1 K/uL  COMPREHENSIVE METABOLIC PANEL     Status: Abnormal   Collection Time    04/01/12  2:10 AM      Result Value Range   Sodium 139  135 - 145 mEq/L   Potassium 3.7  3.5 - 5.1 mEq/L   Chloride 104  96 - 112 mEq/L   CO2 28  19 - 32 mEq/L   Glucose, Bld 98  70 - 99 mg/dL   BUN 11  6 - 23 mg/dL   Creatinine, Ser 4.54  0.50 - 1.10 mg/dL   Calcium 9.2  8.4 - 09.8 mg/dL   Total Protein 7.0  6.0 - 8.3 g/dL   Albumin 3.3 (*) 3.5 - 5.2 g/dL   AST 12  0 - 37 U/L   ALT 13  0 - 35 U/L   Alkaline Phosphatase 76  39 - 117 U/L   Total Bilirubin 0.2 (*) 0.3 - 1.2 mg/dL   GFR calc non Af Amer >90  >90 mL/min   GFR calc Af Amer >90  >90 mL/min    US Transvaginal Non-ob  04/01/2012  *RADIOLOGY REPORT*  Clinical Data: Pelvic pain  TRANSABDOMINAL AND TRANSVAGINAL ULTRASOUND OF PELVIS Technique:  Both transabdominal and transvaginal ultrasound examinations of the pelvis were performed. Transabdominal technique was performed for global imaging of the pelvis including uterus, ovaries, adnexal regions, and pelvic cul-de-sac.  It was necessary to proceed with endovaginal exam following the transabdominal exam to visualize the endometrium and adnexa.  Comparison:  None  Findings:  Uterus: Normal in size and appearance, measuring 11.7 x 5.9 x 6.2 cm.  Endometrium: Normal in thickness and appearance, measuring 8 mm.  Right ovary:  Normal appearance/no adnexal  mass, measuring 3.1 x 1.4 x 1.6 cm.  Left ovary: Normal appearance/no adnexal mass, measuring 3.6 x 1.6 x 2.5 cm.  Other findings: No free fluid.  Tubular cystic appearance in the left adnexa may correspond to a hydrosalpinx. No overt internal debris.  IMPRESSION: Tubular cystic structure in the left adnexa may correspond to a mild hydrosalpinx.  Correlate clinically if concerned for superimposed infection.   Original Report Authenticated By: Jearld Lesch, M.D.    US Pelvis Complete  04/01/2012  *RADIOLOGY REPORT*  Clinical Data: Pelvic pain  TRANSABDOMINAL AND TRANSVAGINAL ULTRASOUND OF PELVIS Technique:  Both transabdominal and transvaginal ultrasound examinations of the pelvis were performed. Transabdominal technique was performed for global imaging of the pelvis including uterus, ovaries, adnexal regions, and pelvic cul-de-sac.  It was necessary to proceed with endovaginal exam following the transabdominal exam to visualize the endometrium and adnexa.  Comparison:  None  Findings:  Uterus: Normal in size and appearance, measuring 11.7 x 5.9 x 6.2 cm.  Endometrium: Normal in thickness and appearance, measuring 8 mm.  Right ovary:  Normal appearance/no adnexal mass, measuring 3.1 x 1.4 x 1.6 cm.  Left ovary: Normal appearance/no adnexal mass, measuring 3.6 x 1.6 x 2.5 cm.  Other findings: No free fluid.  Tubular cystic appearance in the left adnexa may correspond to a hydrosalpinx. No overt internal debris.  IMPRESSION: Tubular cystic structure in the left adnexa may correspond  to a mild hydrosalpinx.  Correlate clinically if concerned for superimposed infection.   Original Report Authenticated By: Jearld Lesch, M.D.     EKG:  NSR, nonspecific T wave abnormality, rate 89 Discussed with Dr. Despina Hidden  Assessment: 32 y.o. morbidly obese female with abdominal pain and back pain   Shortness of breath   Hydrosalpinx   Plan:  Rocephin 250 mg IM   Zithromax 1 gram PO  Rx Doxycycline Instructed patient  to fill Rx's that she got from [facility] 2 days ago Follow up with PCP, follow up in GYN Clinic I have reviewed this patient's vital signs, nurses notes, appropriate labs and imaging.  Results discussed with the patient.  Discussed with patient if she has shortness of breath, chest pain or other problems she must go to [facility] evaluation.   Medication List    TAKE these medications       doxycycline 100 MG capsule  Commonly known as:  VIBRAMYCIN  Take 1 capsule (100 mg total) by mouth 2 (two) times daily.     HYDROcodone-acetaminophen 5-325 MG per tablet  Commonly known as:  NORCO/VICODIN  Take 1 tablet by mouth every 6 (six) hours as needed for pain (Take 1 - 2 tablets every 4 - 6 hours.).     methocarbamol 750 MG tablet  Commonly known as:  ROBAXIN  Take 1 tablet (750 mg total) by mouth 4 (four) times daily as needed (Take 1 tablet every 6 hours as needed for muscle spasms.).     naproxen 375 MG tablet  Commonly known as:  NAPROSYN  Take 1 tablet (375 mg total) by mouth 3 (three) times daily with meals as needed (Take 1 three times as day as needed for pain.).         Procedures  NEESE,HOPE, RN, FNP, [facility] 04/01/2012, 3:35 AM

## 2012-04-02 LAB — GC/CHLAMYDIA PROBE AMP
CT Probe RNA: NEGATIVE
GC Probe RNA: NEGATIVE

## 2012-04-15 ENCOUNTER — Encounter: Payer: Self-pay | Admitting: Obstetrics and Gynecology

## 2012-06-21 ENCOUNTER — Other Ambulatory Visit (HOSPITAL_COMMUNITY): Payer: Self-pay | Admitting: Orthopaedic Surgery

## 2012-06-21 ENCOUNTER — Ambulatory Visit (HOSPITAL_COMMUNITY): Admission: RE | Admit: 2012-06-21 | Payer: Self-pay | Source: Ambulatory Visit

## 2012-06-21 DIAGNOSIS — M25561 Pain in right knee: Secondary | ICD-10-CM

## 2012-06-21 DIAGNOSIS — M7989 Other specified soft tissue disorders: Secondary | ICD-10-CM

## 2012-06-26 ENCOUNTER — Other Ambulatory Visit: Payer: Self-pay | Admitting: Physician Assistant

## 2012-06-26 DIAGNOSIS — M25561 Pain in right knee: Secondary | ICD-10-CM

## 2012-06-30 ENCOUNTER — Other Ambulatory Visit: Payer: Self-pay

## 2012-08-12 ENCOUNTER — Inpatient Hospital Stay (HOSPITAL_COMMUNITY): Payer: Self-pay

## 2012-08-12 ENCOUNTER — Inpatient Hospital Stay (HOSPITAL_COMMUNITY)
Admission: AD | Admit: 2012-08-12 | Discharge: 2012-08-13 | Source: Ambulatory Visit | Attending: Obstetrics & Gynecology | Admitting: Obstetrics & Gynecology

## 2012-08-12 ENCOUNTER — Encounter (HOSPITAL_COMMUNITY): Payer: Self-pay | Admitting: *Deleted

## 2012-08-12 DIAGNOSIS — N946 Dysmenorrhea, unspecified: Secondary | ICD-10-CM

## 2012-08-12 DIAGNOSIS — N938 Other specified abnormal uterine and vaginal bleeding: Secondary | ICD-10-CM | POA: Insufficient documentation

## 2012-08-12 DIAGNOSIS — R109 Unspecified abdominal pain: Secondary | ICD-10-CM | POA: Insufficient documentation

## 2012-08-12 DIAGNOSIS — N949 Unspecified condition associated with female genital organs and menstrual cycle: Secondary | ICD-10-CM | POA: Insufficient documentation

## 2012-08-12 DIAGNOSIS — M545 Low back pain, unspecified: Secondary | ICD-10-CM | POA: Insufficient documentation

## 2012-08-12 DIAGNOSIS — N920 Excessive and frequent menstruation with regular cycle: Secondary | ICD-10-CM

## 2012-08-12 DIAGNOSIS — D500 Iron deficiency anemia secondary to blood loss (chronic): Secondary | ICD-10-CM | POA: Insufficient documentation

## 2012-08-12 LAB — CBC
HCT: 23.5 % — ABNORMAL LOW (ref 36.0–46.0)
MCHC: 31.5 g/dL (ref 30.0–36.0)
MCV: 64.6 fL — ABNORMAL LOW (ref 78.0–100.0)
RDW: 18.3 % — ABNORMAL HIGH (ref 11.5–15.5)
WBC: 7 10*3/uL (ref 4.0–10.5)

## 2012-08-12 LAB — WET PREP, GENITAL
Clue Cells Wet Prep HPF POC: NONE SEEN
Trich, Wet Prep: NONE SEEN
WBC, Wet Prep HPF POC: NONE SEEN
Yeast Wet Prep HPF POC: NONE SEEN

## 2012-08-12 LAB — URINE MICROSCOPIC-ADD ON

## 2012-08-12 LAB — URINALYSIS, ROUTINE W REFLEX MICROSCOPIC
Bilirubin Urine: NEGATIVE
Specific Gravity, Urine: 1.025 (ref 1.005–1.030)
Urobilinogen, UA: 0.2 mg/dL (ref 0.0–1.0)

## 2012-08-12 MED ORDER — SODIUM CHLORIDE 0.9 % IV BOLUS (SEPSIS): 1000.0000 mL | Freq: Once | INTRAVENOUS | Status: DC

## 2012-08-12 MED ORDER — MEGESTROL ACETATE 40 MG PO TABS
40.0000 mg | ORAL_TABLET | Freq: Once | ORAL | Status: AC
  Administered 2012-08-13: 40 mg via ORAL
  Filled 2012-08-12: qty 1

## 2012-08-12 NOTE — MAU Note (Signed)
Pt reports lower abd pain x 5 days, vaginal bleeding since 07/05, began passing large clots 3 days ago. Changing pad q 15 minutes  Since bleeding started.

## 2012-08-12 NOTE — MAU Provider Note (Signed)
Chief Complaint: Vaginal Bleeding and Abdominal Pain   First Provider Initiated Contact with Patient 08/12/12 2154     SUBJECTIVE HPI: April Warner is a 32 y.o. [phone] female who presents with:  1. Heavy vaginal bleeding. Bleeding started 07/27/12, but became heavy w/ clots about 5 days ago. Changing pad Q 15 minutes at times, but bleeding less this evening and in MAU.   2. Low abd cramping x 5 days. Much worse when passing clots.  3. Dull left low back pain x 5 days.  Hx irreg cycles. Pt is incarcerated.  Past Medical History  Diagnosis Date  . Hypertension   . Obesity   . Knee pain   . Arthritis     bil knees   OB History   Grav Para Term Preterm Abortions TAB SAB Ect Mult Living   4 3 3  0 0 0 0 0 0 2     # Outc Date GA Lbr Len/2nd Wgt Sex Del Anes PTL Lv   1 TRM 9/01    F SVD   Yes   2 TRM 5/05    M SVD   Yes   3 TRM 8/09    M LTCS   No   4 GRA              Past Surgical History  Procedure Laterality Date  . Cholecystectomy    . Cesarean section    . Knee surgery     History   Social History  . Marital Status: Single    Spouse Name: N/A    Number of Children: N/A  . Years of Education: N/A   Occupational History  . Not on file.   Social History Main Topics  . Smoking status: Current Every Day Smoker -- 0.50 packs/day    Types: Cigarettes  . Smokeless tobacco: Not on file  . Alcohol Use: No  . Drug Use: No  . Sexually Active: Yes    Birth Control/ Protection: None   Other Topics Concern  . Not on file   Social History Narrative  . No narrative on file   No current facility-administered medications on file prior to encounter.   No current outpatient prescriptions on file prior to encounter.   Allergies  Allergen Reactions  . Milk-Related Compounds Nausea Only and Rash    ROS: Pos for dizziness, low abd pain, left low back pain, vaginal bleeding, mild nausea. Neg for fever, chills, syncope, urinary complaints, vomiting, constipation (had  been constipated, but large BM today), diarrhea, flank pain.   OBJECTIVE Blood pressure 136/76, pulse 98, temperature 98 F (36.7 C), temperature source Oral, resp. rate 16, height 5\' 9"  (1.753 m), weight 195.5 kg (431 lb), last menstrual period 07/29/2012, SpO2 100.00%. Patient Vitals for the past 24 hrs:  BP Temp Temp src Pulse Resp SpO2 Height Weight  08/12/12 2307 136/76 mmHg - - 98 16 - - -  08/12/12 2259 127/59 mmHg - - 75 18 - - -  08/12/12 2254 131/72 mmHg - - 68 20 - - -  08/12/12 2101 127/60 mmHg 98 F (36.7 C) Oral 77 20 - - -  08/12/12 2002 133/75 mmHg - - 85 20 100 % 5\' 9"  (1.753 m) 195.5 kg (431 lb)    GENERAL: Well-developed, well-nourished, morbidly obese female in no acute distress.  HEENT: Normocephalic HEART: normal rate RESP: normal effort ABDOMEN: Soft, non-tender. Pos BS. No CVAT. NEURO: Alert and oriented SPECULUM EXAM: NEFG, small-mod amount of bright red  blood w/ small clots in vault, small amount of active bleeding. cervix clean BIMANUAL: cervix closed; UTA uterine size, no adnexal tenderness or masses  LAB RESULTS Results for orders placed during the hospital encounter of 08/12/12 (from the past 24 hour(s))  CBC     Status: Abnormal   Collection Time    08/12/12  8:19 PM      Result Value Range   WBC 7.0  4.0 - 10.5 K/uL   RBC 3.64 (*) 3.87 - 5.11 MIL/uL   Hemoglobin 7.4 (*) 12.0 - 15.0 g/dL   HCT 16.1 (*) 09.6 - 04.5 %   MCV 64.6 (*) 78.0 - 100.0 fL   MCH 20.3 (*) 26.0 - 34.0 pg   MCHC 31.5  30.0 - 36.0 g/dL   RDW 40.9 (*) 81.1 - 91.4 %   Platelets 185  150 - 400 K/uL  URINALYSIS, ROUTINE W REFLEX MICROSCOPIC     Status: Abnormal   Collection Time    08/12/12  8:40 PM      Result Value Range   Color, Urine RED (*) YELLOW   APPearance TURBID (*) CLEAR   Specific Gravity, Urine 1.025  1.005 - 1.030   pH 5.5  5.0 - 8.0   Glucose, UA NEGATIVE  NEGATIVE mg/dL   Hgb urine dipstick LARGE (*) NEGATIVE   Bilirubin Urine NEGATIVE  NEGATIVE    Ketones, ur NEGATIVE  NEGATIVE mg/dL   Protein, ur 30 (*) NEGATIVE mg/dL   Urobilinogen, UA 0.2  0.0 - 1.0 mg/dL   Nitrite NEGATIVE  NEGATIVE   Leukocytes, UA TRACE (*) NEGATIVE  URINE MICROSCOPIC-ADD ON     Status: None   Collection Time    08/12/12  8:40 PM      Result Value Range   Squamous Epithelial / LPF RARE  RARE   WBC, UA 0-2  <3 WBC/hpf   RBC / HPF TOO NUMEROUS TO COUNT  <3 RBC/hpf   Bacteria, UA RARE  RARE  POCT PREGNANCY, URINE     Status: None   Collection Time    08/12/12  8:44 PM      Result Value Range   Preg Test, Ur NEGATIVE  NEGATIVE  URINALYSIS, ROUTINE W REFLEX MICROSCOPIC     Status: Abnormal   Collection Time    08/12/12 11:00 PM      Result Value Range   Color, Urine YELLOW  YELLOW   APPearance CLEAR  CLEAR   Specific Gravity, Urine 1.020  1.005 - 1.030   pH 5.5  5.0 - 8.0   Glucose, UA NEGATIVE  NEGATIVE mg/dL   Hgb urine dipstick SMALL (*) NEGATIVE   Bilirubin Urine NEGATIVE  NEGATIVE   Ketones, ur NEGATIVE  NEGATIVE mg/dL   Protein, ur NEGATIVE  NEGATIVE mg/dL   Urobilinogen, UA 0.2  0.0 - 1.0 mg/dL   Nitrite NEGATIVE  NEGATIVE   Leukocytes, UA NEGATIVE  NEGATIVE  WET PREP, GENITAL     Status: None   Collection Time    08/12/12 11:00 PM      Result Value Range   Yeast Wet Prep HPF POC NONE SEEN  NONE SEEN   Trich, Wet Prep NONE SEEN  NONE SEEN   Clue Cells Wet Prep HPF POC NONE SEEN  NONE SEEN   WBC, Wet Prep HPF POC NONE SEEN  NONE SEEN  URINE MICROSCOPIC-ADD ON     Status: Abnormal   Collection Time    08/12/12 11:00 PM  Result Value Range   Squamous Epithelial / LPF FEW (*) RARE   WBC, UA 0-2  <3 WBC/hpf   RBC / HPF 3-6  <3 RBC/hpf   Bacteria, UA RARE  RARE  UA likely contaminated. Cath specimen collected.   IMAGING US Transvaginal Non-ob  08/12/2012   *RADIOLOGY REPORT*  Clinical Data: Heavy bleeding and abdominal pain.  History of hydrosalpinx.  TRANSABDOMINAL AND TRANSVAGINAL ULTRASOUND OF PELVIS Technique:  Both  transabdominal and transvaginal ultrasound examinations of the pelvis were performed. Transabdominal technique was performed for global imaging of the pelvis including uterus, ovaries, adnexal regions, and pelvic cul-de-sac.  It was necessary to proceed with endovaginal exam following the transabdominal exam to visualize the ovaries endometrium. Study is technically limited due to the patient's body habitus.  Comparison:  04/01/2012  Findings:  Uterus: The uterus is anteverted and measures 10 x 5.8 x 6.3 cm. No focal myometrial mass lesions.  The cervix is unremarkable.  Endometrium: Endometrial stripe thickness is upper limits of normal at 1.2 cm.  No endometrial fluid collection is demonstrated.  Right ovary:  Limited visualization of the right ovary.  As visualized, the right ovary measures about 2.1 x 1.7 x 1.8 cm.  No abnormal adnexal masses are appreciated.  Left ovary: Left ovary measures 1.4 x 1.2 x 3.1 cm.  Tubular fluid filled structure demonstrated measuring 0.6 x 0.7 cm in transverse dimension.  This is consistent with mild hydrosalpinx and appears stable since previous study.  Other findings: Minimal free fluid in the pelvis.  IMPRESSION: Uterus and ovaries are unremarkable.  Mild left hydrosalpinx, stable since previous study.  Minimal free fluid in the pelvis.   Original Report Authenticated By: Burman Nieves, M.D.   US Pelvis Complete  08/12/2012   *RADIOLOGY REPORT*  Clinical Data: Heavy bleeding and abdominal pain.  History of hydrosalpinx.  TRANSABDOMINAL AND TRANSVAGINAL ULTRASOUND OF PELVIS Technique:  Both transabdominal and transvaginal ultrasound examinations of the pelvis were performed. Transabdominal technique was performed for global imaging of the pelvis including uterus, ovaries, adnexal regions, and pelvic cul-de-sac.  It was necessary to proceed with endovaginal exam following the transabdominal exam to visualize the ovaries endometrium. Study is technically limited due to the  patient's body habitus.  Comparison:  04/01/2012  Findings:  Uterus: The uterus is anteverted and measures 10 x 5.8 x 6.3 cm. No focal myometrial mass lesions.  The cervix is unremarkable.  Endometrium: Endometrial stripe thickness is upper limits of normal at 1.2 cm.  No endometrial fluid collection is demonstrated.  Right ovary:  Limited visualization of the right ovary.  As visualized, the right ovary measures about 2.1 x 1.7 x 1.8 cm.  No abnormal adnexal masses are appreciated.  Left ovary: Left ovary measures 1.4 x 1.2 x 3.1 cm.  Tubular fluid filled structure demonstrated measuring 0.6 x 0.7 cm in transverse dimension.  This is consistent with mild hydrosalpinx and appears stable since previous study.  Other findings: Minimal free fluid in the pelvis.  IMPRESSION: Uterus and ovaries are unremarkable.  Mild left hydrosalpinx, stable since previous study.  Minimal free fluid in the pelvis.   Original Report Authenticated By: Burman Nieves, M.D.    MAU COURSE Mild orthostatic VS. Pt asymptomatic. Per consult w/ Dr. Debroah Loop Will Tx outpatient. First dose megace given in MAU. Pushing PO fluids.     ASSESSMENT 1. Menorrhagia   2. Dysmenorrhea   3. Iron deficiency anemia due to chronic blood loss     PLAN Discharge home  in stable condition. Increase dietary iron and push fluids. Change positions slowly.     Follow-up Information   Follow up with Gynecologist. (For routine care or  as needed if symptoms worsen)       Follow up with [facility]. (As needed if symptoms worsen)    Contact information:   [address] [id number] [place] [address] [phone]       Medication List         docusate sodium 100 MG capsule  Commonly known as:  COLACE  Take 1 capsule (100 mg total) by mouth 2 (two) times daily as needed for constipation.     ferrous sulfate 325 (65 FE) MG tablet  Take 1 tablet (325 mg total) by mouth 3 (three)  times daily with meals.     ketorolac 10 MG tablet  Commonly known as:  TORADOL  Take 1 tablet (10 mg total) by mouth every 6 (six) hours as needed for pain.     megestrol 20 MG tablet  Commonly known as:  MEGACE  - Take 1 tablet (20 mg total) by mouth as directed. Take two tablets (40 mg) three times per day time three days,   - then take two tablets (40 mg) two times per day time three days,   - then take two tablets (40 mg)once per day        [place], [place] 08/13/2012  12:25 AM

## 2012-08-13 DIAGNOSIS — N920 Excessive and frequent menstruation with regular cycle: Secondary | ICD-10-CM

## 2012-08-13 LAB — URINALYSIS, ROUTINE W REFLEX MICROSCOPIC
Glucose, UA: NEGATIVE mg/dL
Leukocytes, UA: NEGATIVE
Protein, ur: NEGATIVE mg/dL
Specific Gravity, Urine: 1.02 (ref 1.005–1.030)
pH: 5.5 (ref 5.0–8.0)

## 2012-08-13 LAB — GC/CHLAMYDIA PROBE AMP: CT Probe RNA: NEGATIVE

## 2012-08-13 MED ORDER — KETOROLAC TROMETHAMINE 60 MG/2ML IM SOLN
60.0000 mg | Freq: Once | INTRAMUSCULAR | Status: AC
  Administered 2012-08-13: 60 mg via INTRAMUSCULAR
  Filled 2012-08-13: qty 2

## 2012-08-13 MED ORDER — FERROUS SULFATE 325 (65 FE) MG PO TABS: 325.0000 mg | ORAL_TABLET | Freq: Three times a day (TID) | ORAL | Status: DC

## 2012-08-13 MED ORDER — KETOROLAC TROMETHAMINE 10 MG PO TABS: 10.0000 mg | ORAL_TABLET | Freq: Four times a day (QID) | ORAL | Status: DC | PRN

## 2012-08-13 MED ORDER — DOCUSATE SODIUM 100 MG PO CAPS
100.0000 mg | ORAL_CAPSULE | Freq: Two times a day (BID) | ORAL | Status: DC | PRN
Start: 2012-08-13 — End: 2012-08-27

## 2012-08-13 MED ORDER — MEGESTROL ACETATE 20 MG PO TABS: 20.0000 mg | ORAL_TABLET | ORAL | Status: DC

## 2012-08-27 ENCOUNTER — Encounter (HOSPITAL_COMMUNITY): Payer: Self-pay | Admitting: Advanced Practice Midwife

## 2012-08-27 ENCOUNTER — Observation Stay (HOSPITAL_COMMUNITY)
Admission: AD | Admit: 2012-08-27 | Discharge: 2012-08-28 | Disposition: A | Discharge status: Home / Self Care | Payer: MEDICAID | Source: Ambulatory Visit | Attending: Obstetrics & Gynecology | Admitting: Obstetrics & Gynecology

## 2012-08-27 DIAGNOSIS — N920 Excessive and frequent menstruation with regular cycle: Secondary | ICD-10-CM

## 2012-08-27 DIAGNOSIS — M549 Dorsalgia, unspecified: Secondary | ICD-10-CM | POA: Insufficient documentation

## 2012-08-27 DIAGNOSIS — D500 Iron deficiency anemia secondary to blood loss (chronic): Secondary | ICD-10-CM | POA: Insufficient documentation

## 2012-08-27 DIAGNOSIS — N949 Unspecified condition associated with female genital organs and menstrual cycle: Secondary | ICD-10-CM | POA: Insufficient documentation

## 2012-08-27 DIAGNOSIS — G8929 Other chronic pain: Secondary | ICD-10-CM | POA: Insufficient documentation

## 2012-08-27 DIAGNOSIS — N926 Irregular menstruation, unspecified: Secondary | ICD-10-CM | POA: Insufficient documentation

## 2012-08-27 DIAGNOSIS — N938 Other specified abnormal uterine and vaginal bleeding: Principal | ICD-10-CM | POA: Insufficient documentation

## 2012-08-27 LAB — URINALYSIS, ROUTINE W REFLEX MICROSCOPIC
Glucose, UA: NEGATIVE mg/dL
Leukocytes, UA: NEGATIVE
Specific Gravity, Urine: 1.025 (ref 1.005–1.030)
pH: 6 (ref 5.0–8.0)

## 2012-08-27 LAB — CBC
HCT: 23 % — ABNORMAL LOW (ref 36.0–46.0)
MCV: 65.2 fL — ABNORMAL LOW (ref 78.0–100.0)
RBC: 3.53 MIL/uL — ABNORMAL LOW (ref 3.87–5.11)
RDW: 20.3 % — ABNORMAL HIGH (ref 11.5–15.5)
WBC: 7.4 10*3/uL (ref 4.0–10.5)

## 2012-08-27 LAB — URINE MICROSCOPIC-ADD ON

## 2012-08-27 LAB — PREPARE RBC (CROSSMATCH)

## 2012-08-27 MED ORDER — MEGESTROL ACETATE 40 MG PO TABS: 40.0000 mg | ORAL_TABLET | Freq: Three times a day (TID) | ORAL | Status: DC

## 2012-08-27 MED ORDER — TRAMADOL HCL 50 MG PO TABS: 100.0000 mg | ORAL_TABLET | Freq: Four times a day (QID) | ORAL | Status: DC | PRN

## 2012-08-27 MED ORDER — SODIUM CHLORIDE 0.9 % IV SOLN
INTRAVENOUS | Status: DC
  Administered 2012-08-27: 16:00:00 via INTRAVENOUS

## 2012-08-27 MED ORDER — PRENATAL MULTIVITAMIN CH
1.0000 | ORAL_TABLET | Freq: Every day | ORAL | Status: DC
  Filled 2012-08-27: qty 1

## 2012-08-27 MED ORDER — OXYCODONE-ACETAMINOPHEN 5-325 MG PO TABS
1.0000 | ORAL_TABLET | ORAL | Status: DC | PRN
  Administered 2012-08-27 (×2): 1 via ORAL
  Filled 2012-08-27 (×3): qty 1

## 2012-08-27 MED ORDER — MEGESTROL ACETATE 40 MG PO TABS
40.0000 mg | ORAL_TABLET | Freq: Three times a day (TID) | ORAL | Status: DC
Start: 2012-08-27 — End: 2012-08-28
  Administered 2012-08-27 (×2): 40 mg via ORAL
  Filled 2012-08-27 (×3): qty 1

## 2012-08-27 NOTE — MAU Note (Addendum)
Pt reports vaginal bleeding x 2 months with cramping and back pain.  Headache x 5 days.  Pt seen in MAU in July, was given megace, bleeding stopped for about 1 day.  Pt reports only taking megace for one day.

## 2012-08-27 NOTE — H&P (Signed)
April Warner is an 32 y.o. female. Who presents with c/o heavy bleeding for 2 months Was seen 7/21 for the same and given Megace, but only took it for one day. States was incarcerated and they did not get the med for her. Has chronic back pain which is worse with this bleeding Has always had normal periods until this started, though told other provider she had a history of irregular periods.  Reports no IC in over 6 mos.   Pertinent Gynecological History: Menses: flow is excessive with use of multiple pads or tampons on heaviest days Bleeding: dysfunctional uterine bleeding Contraception: none DES exposure: unknown Blood transfusions: none Sexually transmitted diseases: no past history Previous GYN Procedures: none  Last mammogram: none Last pap: uknown  OB History: G3, P3   Menstrual History:  Patient's last menstrual period was 06/29/2012.    Past Medical History  Diagnosis Date  . Hypertension   . Obesity   . Knee pain   . Arthritis     bil knees    Past Surgical History  Procedure Laterality Date  . Cholecystectomy    . Cesarean section    . Knee surgery      Family History  Problem Relation Age of Onset  . Diabetes Maternal Aunt   . Cancer Maternal Aunt   . Diabetes Maternal Grandmother   . Cancer Paternal Grandmother     Social History:  reports that she has been smoking Cigarettes.  She has been smoking about 0.50 packs per day. She does not have any smokeless tobacco history on file. She reports that she does not drink alcohol or use illicit drugs.  Allergies:  Allergies  Allergen Reactions  . Toradol (Ketorolac Tromethamine) Nausea And Vomiting and Other (See Comments)    diarrhea    Prescriptions prior to admission  Medication Sig Dispense Refill  . ferrous sulfate 325 (65 FE) MG tablet Take 325 mg by mouth 2 (two) times daily.        Review of Systems  Constitutional: Positive for malaise/fatigue. Negative for fever and chills.   Gastrointestinal: Positive for abdominal pain. Negative for nausea and vomiting.  Musculoskeletal: Positive for back pain.  Neurological: Positive for dizziness and weakness. Negative for focal weakness and headaches.    Blood pressure 130/58, pulse 78, temperature 98 F (36.7 C), temperature source Oral, resp. rate 18, height 5\' 9"  (1.753 m), weight 195.5 kg (431 lb), last menstrual period 06/29/2012. Physical Exam  Constitutional: She is oriented to person, place, and time. She appears well-developed and well-nourished. No distress.  HENT:  Head: Normocephalic.  Cardiovascular: Normal rate.   Respiratory: Effort normal.  GI: Soft. There is no tenderness.  Genitourinary: Uterus normal. Vaginal discharge (steady trickle of blood from os, mod sized clot, cervix closed, uterus not palpable due to habitus) found.  Musculoskeletal: Normal range of motion.  Neurological: She is alert and oriented to person, place, and time.  Skin: Skin is warm and dry.  Psychiatric: She has a normal mood and affect.    Results for orders placed during the hospital encounter of 08/27/12 (from the past 24 hour(s))  URINALYSIS, ROUTINE W REFLEX MICROSCOPIC     Status: Abnormal   Collection Time    08/27/12  2:00 PM      Result Value Range   Color, Urine RED (*) YELLOW   APPearance TURBID (*) CLEAR   Specific Gravity, Urine 1.025  1.005 - 1.030   pH 6.0  5.0 - 8.0  Glucose, UA NEGATIVE  NEGATIVE mg/dL   Hgb urine dipstick LARGE (*) NEGATIVE   Bilirubin Urine NEGATIVE  NEGATIVE   Ketones, ur NEGATIVE  NEGATIVE mg/dL   Protein, ur 161 (*) NEGATIVE mg/dL   Urobilinogen, UA 0.2  0.0 - 1.0 mg/dL   Nitrite NEGATIVE  NEGATIVE   Leukocytes, UA NEGATIVE  NEGATIVE  URINE MICROSCOPIC-ADD ON     Status: Abnormal   Collection Time    08/27/12  2:00 PM      Result Value Range   Squamous Epithelial / LPF FEW (*) RARE   RBC / HPF TOO NUMEROUS TO COUNT  <3 RBC/hpf   Urine-Other FIELD OBSCURED BY RBC'S    CBC      Status: Abnormal   Collection Time    08/27/12  2:15 PM      Result Value Range   WBC 7.4  4.0 - 10.5 K/uL   RBC 3.53 (*) 3.87 - 5.11 MIL/uL   Hemoglobin 7.1 (*) 12.0 - 15.0 g/dL   HCT 09.6 (*) 04.5 - 40.9 %   MCV 65.2 (*) 78.0 - 100.0 fL   MCH 20.1 (*) 26.0 - 34.0 pg   MCHC 30.9  30.0 - 36.0 g/dL   RDW 81.1 (*) 91.4 - 78.2 %   Platelets 201  150 - 400 K/uL  POCT PREGNANCY, URINE     Status: None   Collection Time    08/27/12  2:16 PM      Result Value Range   Preg Test, Ur NEGATIVE  NEGATIVE    No results found.  Assessment/Plan: A:  Dysfunctional Uterine Bleeding      Anemia, secondary to blood loss.  P  Discussed with Dr Penne Lash      Will admit for observation for blood transfusion. Risks and benefits reviewed.       Restart Megace.      Take two tablets (40 mg) three times per day time three days,  then take two tablets (40 mg) two times per day time three days,  then take two tablets (40 mg)once per day      Transfuse 2 units of blood Ishani Goldwasser 08/27/2012, 2:56 PM

## 2012-08-27 NOTE — Discharge Summary (Signed)
Physician Discharge Summary  Patient ID: April Warner MRN: [id number] DOB/AGE: 1980/09/06 31 y.o.  Admit date: 08/27/2012 Discharge date: 08/27/2012  Admission Diagnoses:  Abnormal vaginal bleeding                                          Anemia secondary to bldg  Discharge Diagnoses:  Abnormal vaginal bleeding                                          S/p blood tx x 2 units Active Problems:   * No active hospital problems. *   Discharged Condition: good  Hospital Course: [name] is a 31yo [id number] who presented with c/o heavy bleeding for 2 months. Was seen 7/21 for the same and given Megace, but only took it for one day. States was incarcerated and they did not get the med for her. Has chronic back pain which is worse with this bleeding. Has always had normal periods until this started, though told other provider she had a history of irregular periods.  She has a hx of abnormal vag bldg.  She was placed in observation status in order to receive PRBC x 2 units and to start Megace.  Post transfusion she is resting and denies s/s anemia. Her back pain has been relieved with Percocet. She has been deemed to have received the full benefit of her hospital stay and she will be discharged home.     Consults: None  Significant Diagnostic Studies: labs:  CBC    Component Value Date/Time   WBC 7.4 08/27/2012 1415   RBC 3.53* 08/27/2012 1415   HGB 7.1* 08/27/2012 1415   HCT 23.0* 08/27/2012 1415   PLT 201 08/27/2012 1415   MCV 65.2* 08/27/2012 1415   MCH 20.1* 08/27/2012 1415   MCHC 30.9 08/27/2012 1415   RDW 20.3* 08/27/2012 1415   LYMPHSABS 2.7 04/01/2012 0210   MONOABS 0.6 04/01/2012 0210   EOSABS 0.2 04/01/2012 0210   BASOSABS 0.0 04/01/2012 0210     Treatments: PRBC x 2 units  Discharge Exam: Blood pressure 138/68, pulse 84, temperature 98.5 F (36.9 C), temperature source Oral, resp. rate 20, height 5\' 9"  (1.753 m), weight 195.5 kg (431 lb), last menstrual period 06/29/2012, SpO2 98.00%. General  appearance: alert, cooperative and no distress Resp: nl effort Extremities: extremities normal, atraumatic, no cyanosis or edema Neurologic: Mental status: Alert, oriented, thought content appropriate  Disposition: [facility]-Another Health Care Institution Not Defined     Medication List         ferrous sulfate 325 (65 FE) MG tablet  Take 325 mg by mouth 2 (two) times daily.     megestrol 40 MG tablet  Commonly known as:  MEGACE  Take 1 tablet (40 mg total) by mouth 3 (three) times daily. For 3 days, then take 40mg  by mouth twice daily for 3 days, then 40mg  by mouth once daily for 3 days.     traMADol 50 MG tablet  Commonly known as:  ULTRAM  Take 2 tablets (100 mg total) by mouth every 6 (six) hours as needed for pain.           Follow-up Information   Follow up with [facility]. (Call for an appointment as needed)  Contact information:   [address] [place] [address] [phone]      Signed: Cam Hai 08/27/2012, 11:37 PM

## 2012-08-28 LAB — TYPE AND SCREEN
Antibody Screen: NEGATIVE
Unit division: 0

## 2012-09-03 ENCOUNTER — Observation Stay (HOSPITAL_COMMUNITY)
Admission: AD | Admit: 2012-09-03 | Discharge: 2012-09-04 | Disposition: A | Discharge status: Home / Self Care | Payer: Self-pay | Source: Ambulatory Visit | Attending: Obstetrics & Gynecology | Admitting: Obstetrics & Gynecology

## 2012-09-03 ENCOUNTER — Encounter (HOSPITAL_COMMUNITY): Payer: Self-pay | Admitting: *Deleted

## 2012-09-03 DIAGNOSIS — D62 Acute posthemorrhagic anemia: Secondary | ICD-10-CM

## 2012-09-03 DIAGNOSIS — N946 Dysmenorrhea, unspecified: Secondary | ICD-10-CM

## 2012-09-03 DIAGNOSIS — D500 Iron deficiency anemia secondary to blood loss (chronic): Secondary | ICD-10-CM

## 2012-09-03 DIAGNOSIS — N921 Excessive and frequent menstruation with irregular cycle: Secondary | ICD-10-CM

## 2012-09-03 DIAGNOSIS — N949 Unspecified condition associated with female genital organs and menstrual cycle: Secondary | ICD-10-CM

## 2012-09-03 DIAGNOSIS — N938 Other specified abnormal uterine and vaginal bleeding: Principal | ICD-10-CM | POA: Insufficient documentation

## 2012-09-03 LAB — URINALYSIS, ROUTINE W REFLEX MICROSCOPIC
Nitrite: NEGATIVE
Specific Gravity, Urine: 1.025 (ref 1.005–1.030)
Urobilinogen, UA: 0.2 mg/dL (ref 0.0–1.0)
pH: 6 (ref 5.0–8.0)

## 2012-09-03 LAB — CBC
HCT: 22.7 % — ABNORMAL LOW (ref 36.0–46.0)
Hemoglobin: 7.2 g/dL — ABNORMAL LOW (ref 12.0–15.0)
MCH: 22.8 pg — ABNORMAL LOW (ref 26.0–34.0)
MCV: 67.2 fL — ABNORMAL LOW (ref 78.0–100.0)
MCV: 70.2 fL — ABNORMAL LOW (ref 78.0–100.0)
Platelets: 160 10*3/uL (ref 150–400)
RBC: 3.38 MIL/uL — ABNORMAL LOW (ref 3.87–5.11)
RDW: 21.6 % — ABNORMAL HIGH (ref 11.5–15.5)
RDW: 21.8 % — ABNORMAL HIGH (ref 11.5–15.5)
WBC: 9.5 10*3/uL (ref 4.0–10.5)
WBC: 9.4 10*3/uL (ref 4.0–10.5)

## 2012-09-03 LAB — URINE MICROSCOPIC-ADD ON

## 2012-09-03 MED ORDER — ALUM & MAG HYDROXIDE-SIMETH 200-200-20 MG/5ML PO SUSP
30.0000 mL | ORAL | Status: DC | PRN
  Administered 2012-09-03: 30 mL via ORAL
  Filled 2012-09-03: qty 30

## 2012-09-03 MED ORDER — ACETAMINOPHEN 325 MG PO TABS
650.0000 mg | ORAL_TABLET | Freq: Once | ORAL | Status: AC
  Administered 2012-09-03: 650 mg via ORAL
  Filled 2012-09-03: qty 2

## 2012-09-03 MED ORDER — MEDROXYPROGESTERONE ACETATE 400 MG/ML IM SUSP
300.0000 mg | Freq: Once | INTRAMUSCULAR | Status: AC
  Administered 2012-09-03: 300 mg via INTRAMUSCULAR
  Filled 2012-09-03: qty 0.75

## 2012-09-03 MED ORDER — NICOTINE 21 MG/24HR TD PT24
21.0000 mg | MEDICATED_PATCH | Freq: Every day | TRANSDERMAL | Status: DC
  Administered 2012-09-03: 21 mg via TRANSDERMAL
  Filled 2012-09-03 (×3): qty 1

## 2012-09-03 MED ORDER — MEDROXYPROGESTERONE ACETATE 150 MG/ML IM SUSP: 300.0000 mg | Freq: Once | INTRAMUSCULAR | Status: DC

## 2012-09-03 MED ORDER — SODIUM CHLORIDE 0.9 % IV BOLUS (SEPSIS)
1000.0000 mL | Freq: Once | INTRAVENOUS | Status: AC
  Administered 2012-09-03: 1000 mL via INTRAVENOUS

## 2012-09-03 MED ORDER — NORETHINDRONE-ETH ESTRADIOL 0.4-35 MG-MCG PO TABS
1.0000 | ORAL_TABLET | Freq: Three times a day (TID) | ORAL | Status: DC
  Administered 2012-09-03 – 2012-09-04 (×2): 1 via ORAL
  Filled 2012-09-03 (×6): qty 1

## 2012-09-03 MED ORDER — DIPHENHYDRAMINE HCL 25 MG PO CAPS
25.0000 mg | ORAL_CAPSULE | Freq: Once | ORAL | Status: AC
  Administered 2012-09-03: 25 mg via ORAL
  Filled 2012-09-03: qty 1

## 2012-09-03 MED ORDER — TRAMADOL HCL 50 MG PO TABS
100.0000 mg | ORAL_TABLET | Freq: Four times a day (QID) | ORAL | Status: DC | PRN
  Filled 2012-09-03: qty 2
  Filled 2012-09-03: qty 1

## 2012-09-03 MED ORDER — SODIUM CHLORIDE 0.9 % IV SOLN
INTRAVENOUS | Status: DC
  Administered 2012-09-03: 08:00:00 via INTRAVENOUS

## 2012-09-03 MED ORDER — ACETAMINOPHEN 325 MG PO TABS
650.0000 mg | ORAL_TABLET | ORAL | Status: DC | PRN
  Administered 2012-09-03: 650 mg via ORAL
  Filled 2012-09-03: qty 2

## 2012-09-03 MED ORDER — OXYCODONE HCL 5 MG PO TABS
10.0000 mg | ORAL_TABLET | ORAL | Status: DC | PRN
  Administered 2012-09-03: 10 mg via ORAL
  Administered 2012-09-03: 5 mg via ORAL
  Administered 2012-09-03 – 2012-09-04 (×2): 10 mg via ORAL
  Filled 2012-09-03: qty 2
  Filled 2012-09-03 (×3): qty 1
  Filled 2012-09-03: qty 2

## 2012-09-03 MED ORDER — FERROUS SULFATE 325 (65 FE) MG PO TABS
325.0000 mg | ORAL_TABLET | Freq: Three times a day (TID) | ORAL | Status: DC
Start: 2012-09-03 — End: 2012-09-04
  Administered 2012-09-03: 325 mg via ORAL
  Filled 2012-09-03: qty 1

## 2012-09-03 MED ORDER — OXYCODONE-ACETAMINOPHEN 5-325 MG PO TABS
2.0000 | ORAL_TABLET | Freq: Once | ORAL | Status: AC
  Administered 2012-09-03: 2 via ORAL
  Filled 2012-09-03: qty 2

## 2012-09-03 NOTE — H&P (Signed)
Gynecology Admission H&P  Chief Complaint: Vaginal Bleeding and Abdominal Pain   SUBJECTIVE HPI: April Warner is a 32 y.o. [phone] female who presents to MAU with heavy vaginal bleeding and low abd cramping since beginning of July. Seen in MAU twice in that time. Admitted 08/27/2012 for transfusion. Rx Megace taper. Taking as directed. Slight improvement after discharge, but increased again. Soaking a pad an hour x 4 days.  Reports feeling dizzy, tired and lightheaded.    Past Medical History  Diagnosis Date  . Hypertension   . Obesity   . Knee pain   . Arthritis     bil knees   OB History   Grav Para Term Preterm Abortions TAB SAB Ect Mult Living   3 3 2 1  0 0 0 0 0 2     # Outc Date GA Lbr Len/2nd Wgt Sex Del Anes PTL Lv   1 TRM 9/01    F SVD   Yes   2 TRM 5/05    M SVD   Yes   3 PRE 8/09    M LTCS  Yes No   Comments: PPROM     Past Surgical History  Procedure Laterality Date  . Cholecystectomy    . Cesarean section    . Knee surgery     History   Social History  . Marital Status: Single    Spouse Name: N/A    Number of Children: N/A  . Years of Education: N/A   Occupational History  . Not on file.   Social History Main Topics  . Smoking status: Current Every Day Smoker -- 0.50 packs/day    Types: Cigarettes  . Smokeless tobacco: Not on file  . Alcohol Use: No  . Drug Use: No  . Sexually Active: Yes    Birth Control/ Protection: None   Other Topics Concern  . Not on file   Social History Narrative  . No narrative on file   No current facility-administered medications on file prior to encounter.   Current Outpatient Prescriptions on File Prior to Encounter  Medication Sig Dispense Refill  . ferrous sulfate 325 (65 FE) MG tablet Take 325 mg by mouth 2 (two) times daily.      . megestrol (MEGACE) 40 MG tablet Take 1 tablet (40 mg total) by mouth 3 (three) times daily. For 3 days, then take 40mg  by mouth twice daily for 3 days, then 40mg  by mouth once  daily for 3 days.  18 tablet  0  . traMADol (ULTRAM) 50 MG tablet Take 2 tablets (100 mg total) by mouth every 6 (six) hours as needed for pain.  30 tablet  0   Allergies  Allergen Reactions  . Toradol (Ketorolac Tromethamine) Nausea And Vomiting and Other (See Comments)    diarrhea    ROS: Pos for vaginal bleeding, low abd cramping, vomiting x 2, loose stool x 1. Neg for fever, urinary complaints  OBJECTIVE Blood pressure 123/55, pulse 88, temperature 98.6 F (37 C), temperature source Oral, resp. rate 22, last menstrual period 06/29/2012, SpO2 100.00%. GENERAL: Well-developed, well-nourished female in mild distress.  HEENT: Normocephalic HEART: normal rate RESP: normal effort ABDOMEN: Soft, obese, non-tender. Pos BS x 4 EXTREMITIES: Nontender, no edema NEURO: Alert and oriented SPECULUM EXAM: NEFG,  moderate amount of  blood noted  LAB RESULTS Results for orders placed during the hospital encounter of 09/03/12 (from the past 24 hour(s))  CBC     Status: Abnormal   Collection  Time    09/03/12  4:05 AM      Result Value Range   WBC 9.5  4.0 - 10.5 K/uL   RBC 3.38 (*) 3.87 - 5.11 MIL/uL   Hemoglobin 7.2 (*) 12.0 - 15.0 g/dL   HCT 11.9 (*) 14.7 - 82.9 %   MCV 67.2 (*) 78.0 - 100.0 fL   MCH 21.3 (*) 26.0 - 34.0 pg   MCHC 31.7  30.0 - 36.0 g/dL   RDW 56.2 (*) 13.0 - 86.5 %   Platelets 189  150 - 400 K/uL  TYPE AND SCREEN     Status: None   Collection Time    09/03/12  4:05 AM      Result Value Range   ABO/RH(D) O POS     Antibody Screen NEG     Sample Expiration 09/06/2012    URINALYSIS, ROUTINE W REFLEX MICROSCOPIC     Status: Abnormal   Collection Time    09/03/12  5:45 AM      Result Value Range   Color, Urine YELLOW  YELLOW   APPearance HAZY (*) CLEAR   Specific Gravity, Urine 1.025  1.005 - 1.030   pH 6.0  5.0 - 8.0   Glucose, UA NEGATIVE  NEGATIVE mg/dL   Hgb urine dipstick LARGE (*) NEGATIVE   Bilirubin Urine NEGATIVE  NEGATIVE   Ketones, ur NEGATIVE   NEGATIVE mg/dL   Protein, ur NEGATIVE  NEGATIVE mg/dL   Urobilinogen, UA 0.2  0.0 - 1.0 mg/dL   Nitrite NEGATIVE  NEGATIVE   Leukocytes, UA NEGATIVE  NEGATIVE  URINE MICROSCOPIC-ADD ON     Status: Abnormal   Collection Time    09/03/12  5:45 AM      Result Value Range   Squamous Epithelial / LPF RARE  RARE   WBC, UA 0-2  <3 WBC/hpf   RBC / HPF TOO NUMEROUS TO COUNT  <3 RBC/hpf   Bacteria, UA FEW (*) RARE   Urine-Other MUCOUS PRESENT      IMAGING 08/12/2012  TRANSABDOMINAL AND TRANSVAGINAL ULTRASOUND OF PELVIS  Clinical Data: Heavy bleeding and abdominal pain.  History of hydrosalpinx.   Technique:  Both transabdominal and transvaginal ultrasound examinations of the pelvis were performed. Transabdominal technique was performed for global imaging of the pelvis including uterus, ovaries, adnexal regions, and pelvic cul-de-sac.  It was necessary to proceed with endovaginal exam following the transabdominal exam to visualize the ovaries endometrium. Study is technically limited due to the patient's body habitus.  Comparison:  04/01/2012  Findings:  Uterus: The uterus is anteverted and measures 10 x 5.8 x 6.3 cm. No focal myometrial mass lesions.  The cervix is unremarkable.  Endometrium: Endometrial stripe thickness is upper limits of normal at 1.2 cm.  No endometrial fluid collection is demonstrated.  Right ovary:  Limited visualization of the right ovary.  As visualized, the right ovary measures about 2.1 x 1.7 x 1.8 cm.  No abnormal adnexal masses are appreciated.  Left ovary: Left ovary measures 1.4 x 1.2 x 3.1 cm.  Tubular fluid filled structure demonstrated measuring 0.6 x 0.7 cm in transverse dimension.  This is consistent with mild hydrosalpinx and appears stable since previous study.  Other findings: Minimal free fluid in the pelvis.  IMPRESSION: Uterus and ovaries are unremarkable.  Mild left hydrosalpinx, stable since previous study.  Minimal free fluid in the pelvis.   Original Report  Authenticated By: Burman Nieves, M.D.   MAU COURSE Received IV fluid bolus, but was  still symptomatic  ASSESSMENT DUB Symptomatic anemia  PLAN Admit for transfusion of 3 units pRBCs Progestin options discussed with patient; she desires Depo Provera. Depo Provera 300 mg IM ordered. She will consider Mirena IUD later. Plan discussed with Dr. Precious Gilding, CNM 09/03/2012  3:40 AM   Attestation of Attending Supervision of Advanced Practitioner (PA/CNM/NP): Evaluation and management procedures were performed by the Advanced Practitioner under my supervision and collaboration.  I have reviewed the Advanced Practitioner's note and chart, and I agree with the management and plan.  Jaynie Collins, MD, FACOG Attending Obstetrician & Gynecologist Faculty Practice, [facility] [place]

## 2012-09-03 NOTE — Progress Notes (Signed)
Ur chart review completed.  

## 2012-09-03 NOTE — MAU Provider Note (Signed)
Chief Complaint: Vaginal Bleeding and Abdominal Pain   First Provider Initiated Contact with Patient 09/03/12 0622     SUBJECTIVE HPI: April Warner is a 32 y.o. [id number] female who presents to MAU with heavy vaginal bleeding and low abd cramping since beginning of July. Seen in MAU twice in that time. Admitted 08/27/2012 for transfusion. Rx Megace taper. Taking as directed. Slight improvement after discharge, but increased again. Soaking a pad an hour x 4 days.  Reports feeling dizzy, tired and lightheaded.    Past Medical History  Diagnosis Date  . Hypertension   . Obesity   . Knee pain   . Arthritis     bil knees   OB History   Grav Para Term Preterm Abortions TAB SAB Ect Mult Living   3 3 2 1  0 0 0 0 0 2     # Outc Date GA Lbr Len/2nd Wgt Sex Del Anes PTL Lv   1 TRM 9/01    F SVD   Yes   2 TRM 5/05    M SVD   Yes   3 PRE 8/09    M LTCS  Yes No   Comments: PPROM     Past Surgical History  Procedure Laterality Date  . Cholecystectomy    . Cesarean section    . Knee surgery     History   Social History  . Marital Status: Single    Spouse Name: N/A    Number of Children: N/A  . Years of Education: N/A   Occupational History  . Not on file.   Social History Main Topics  . Smoking status: Current Every Day Smoker -- 0.50 packs/day    Types: Cigarettes  . Smokeless tobacco: Not on file  . Alcohol Use: No  . Drug Use: No  . Sexually Active: Yes    Birth Control/ Protection: None   Other Topics Concern  . Not on file   Social History Narrative  . No narrative on file   No current facility-administered medications on file prior to encounter.   Current Outpatient Prescriptions on File Prior to Encounter  Medication Sig Dispense Refill  . ferrous sulfate 325 (65 FE) MG tablet Take 325 mg by mouth 2 (two) times daily.      . megestrol (MEGACE) 40 MG tablet Take 1 tablet (40 mg total) by mouth 3 (three) times daily. For 3 days, then take 40mg  by mouth twice daily  for 3 days, then 40mg  by mouth once daily for 3 days.  18 tablet  0  . traMADol (ULTRAM) 50 MG tablet Take 2 tablets (100 mg total) by mouth every 6 (six) hours as needed for pain.  30 tablet  0   Allergies  Allergen Reactions  . Toradol (Ketorolac Tromethamine) Nausea And Vomiting and Other (See Comments)    diarrhea    ROS: Pos for vaginal bleeding, low abd cramping, vomiting x 2, loose stool x 1. Neg for fever, urinary complaints  OBJECTIVE Blood pressure 123/55, pulse 88, temperature 98.6 F (37 C), temperature source Oral, resp. rate 22, last menstrual period 06/29/2012, SpO2 100.00%. GENERAL: Well-developed, well-nourished female in mild distress.  HEENT: Normocephalic HEART: normal rate RESP: normal effort ABDOMEN: Soft, obese, non-tender. Pos BS x 4 EXTREMITIES: Nontender, no edema NEURO: Alert and oriented SPECULUM EXAM: NEFG,  moderate amount of  blood noted  LAB RESULTS Results for orders placed during the hospital encounter of 09/03/12 (from the past 24 hour(s))  CBC  Status: Abnormal   Collection Time    09/03/12  4:05 AM      Result Value Range   WBC 9.5  4.0 - 10.5 K/uL   RBC 3.38 (*) 3.87 - 5.11 MIL/uL   Hemoglobin 7.2 (*) 12.0 - 15.0 g/dL   HCT 16.1 (*) 09.6 - 04.5 %   MCV 67.2 (*) 78.0 - 100.0 fL   MCH 21.3 (*) 26.0 - 34.0 pg   MCHC 31.7  30.0 - 36.0 g/dL   RDW 40.9 (*) 81.1 - 91.4 %   Platelets 189  150 - 400 K/uL  TYPE AND SCREEN     Status: None   Collection Time    09/03/12  4:05 AM      Result Value Range   ABO/RH(D) O POS     Antibody Screen NEG     Sample Expiration 09/06/2012    URINALYSIS, ROUTINE W REFLEX MICROSCOPIC     Status: Abnormal   Collection Time    09/03/12  5:45 AM      Result Value Range   Color, Urine YELLOW  YELLOW   APPearance HAZY (*) CLEAR   Specific Gravity, Urine 1.025  1.005 - 1.030   pH 6.0  5.0 - 8.0   Glucose, UA NEGATIVE  NEGATIVE mg/dL   Hgb urine dipstick LARGE (*) NEGATIVE   Bilirubin Urine NEGATIVE   NEGATIVE   Ketones, ur NEGATIVE  NEGATIVE mg/dL   Protein, ur NEGATIVE  NEGATIVE mg/dL   Urobilinogen, UA 0.2  0.0 - 1.0 mg/dL   Nitrite NEGATIVE  NEGATIVE   Leukocytes, UA NEGATIVE  NEGATIVE  URINE MICROSCOPIC-ADD ON     Status: Abnormal   Collection Time    09/03/12  5:45 AM      Result Value Range   Squamous Epithelial / LPF RARE  RARE   WBC, UA 0-2  <3 WBC/hpf   RBC / HPF TOO NUMEROUS TO COUNT  <3 RBC/hpf   Bacteria, UA FEW (*) RARE   Urine-Other MUCOUS PRESENT      IMAGING 08/12/2012  TRANSABDOMINAL AND TRANSVAGINAL ULTRASOUND OF PELVIS  Clinical Data: Heavy bleeding and abdominal pain.  History of hydrosalpinx.   Technique:  Both transabdominal and transvaginal ultrasound examinations of the pelvis were performed. Transabdominal technique was performed for global imaging of the pelvis including uterus, ovaries, adnexal regions, and pelvic cul-de-sac.  It was necessary to proceed with endovaginal exam following the transabdominal exam to visualize the ovaries endometrium. Study is technically limited due to the patient's body habitus.  Comparison:  04/01/2012  Findings:  Uterus: The uterus is anteverted and measures 10 x 5.8 x 6.3 cm. No focal myometrial mass lesions.  The cervix is unremarkable.  Endometrium: Endometrial stripe thickness is upper limits of normal at 1.2 cm.  No endometrial fluid collection is demonstrated.  Right ovary:  Limited visualization of the right ovary.  As visualized, the right ovary measures about 2.1 x 1.7 x 1.8 cm.  No abnormal adnexal masses are appreciated.  Left ovary: Left ovary measures 1.4 x 1.2 x 3.1 cm.  Tubular fluid filled structure demonstrated measuring 0.6 x 0.7 cm in transverse dimension.  This is consistent with mild hydrosalpinx and appears stable since previous study.  Other findings: Minimal free fluid in the pelvis.  IMPRESSION: Uterus and ovaries are unremarkable.  Mild left hydrosalpinx, stable since previous study.  Minimal free fluid in the  pelvis.   Original Report Authenticated By: Burman Nieves, M.D.   MAU COURSE Received  IV fluid bolus, but was still symptomatic  ASSESSMENT DUB Symptomatic anemia  PLAN Admit for transfusion of 3 units pRBCs Progestin options discussed with patient; she desires Depo Provera. Depo Provera 300 mg IM ordered. She will consider Mirena IUD later. Plan discussed with Dr. Precious Gilding, CNM 09/03/2012  3:40 AM   Attestation of Attending Supervision of Advanced Practitioner (PA/CNM/NP): Evaluation and management procedures were performed by the Advanced Practitioner under my supervision and collaboration.  I have reviewed the Advanced Practitioner's note and chart, and I agree with the management and plan.  Jaynie Collins, MD, FACOG Attending Obstetrician & Gynecologist Faculty Practice, [facility]

## 2012-09-03 NOTE — Discharge Summary (Signed)
Attestation of Attending Supervision of Advanced Practitioner (CNM/NP): Evaluation and management procedures were performed by the Advanced Practitioner under my supervision and collaboration. I have reviewed the Advanced Practitioner's note and chart, and I agree with the management and plan.  LEGGETT,KELLY H. 3:40 PM

## 2012-09-03 NOTE — MAU Note (Signed)
Pt presents via EMS with complaint of worsening vaginal bleeding. States she was hospitalized on 08/05 for same and had a blood transfusion. States the bleeding and the abd pain haven worsened since she went home.

## 2012-09-04 ENCOUNTER — Encounter (HOSPITAL_COMMUNITY): Payer: Self-pay | Admitting: Anesthesiology

## 2012-09-04 ENCOUNTER — Observation Stay (HOSPITAL_COMMUNITY): Payer: Self-pay | Admitting: Anesthesiology

## 2012-09-04 ENCOUNTER — Encounter (HOSPITAL_COMMUNITY)
Admission: AD | Disposition: A | Discharge status: Home / Self Care | Payer: Self-pay | Source: Ambulatory Visit | Attending: Obstetrics & Gynecology

## 2012-09-04 DIAGNOSIS — N920 Excessive and frequent menstruation with regular cycle: Secondary | ICD-10-CM

## 2012-09-04 DIAGNOSIS — D500 Iron deficiency anemia secondary to blood loss (chronic): Secondary | ICD-10-CM

## 2012-09-04 DIAGNOSIS — N949 Unspecified condition associated with female genital organs and menstrual cycle: Secondary | ICD-10-CM

## 2012-09-04 DIAGNOSIS — N938 Other specified abnormal uterine and vaginal bleeding: Secondary | ICD-10-CM | POA: Diagnosis present

## 2012-09-04 HISTORY — PX: DILATION AND CURETTAGE OF UTERUS: SHX78

## 2012-09-04 LAB — CBC
MCH: 23.9 pg — ABNORMAL LOW (ref 26.0–34.0)
MCHC: 33.3 g/dL (ref 30.0–36.0)
Platelets: 147 10*3/uL — ABNORMAL LOW (ref 150–400)

## 2012-09-04 LAB — TROPONIN I: Troponin I: <0.3 ng/mL (ref <0.30)

## 2012-09-04 LAB — SURGICAL PCR SCREEN: Staphylococcus aureus: NEGATIVE

## 2012-09-04 SURGERY — DILATION AND CURETTAGE
Anesthesia: General | Site: Vagina | Wound class: Clean Contaminated

## 2012-09-04 MED ORDER — GLYCINE 1.5 % IR SOLN
Status: DC | PRN
  Administered 2012-09-04: 3000 mL

## 2012-09-04 MED ORDER — OXYCODONE HCL 10 MG PO TABS: 10.0000 mg | ORAL_TABLET | ORAL | Status: DC | PRN

## 2012-09-04 MED ORDER — BUPIVACAINE HCL 0.5 % IJ SOLN
INTRAMUSCULAR | Status: DC | PRN
  Administered 2012-09-04: 30 mL

## 2012-09-04 MED ORDER — DEXTROSE 5 % IV SOLN: 3.0000 g | Freq: Once | INTRAVENOUS | Status: DC

## 2012-09-04 MED ORDER — LACTATED RINGERS IV SOLN
INTRAVENOUS | Status: DC | PRN
  Administered 2012-09-04: 10:00:00 via INTRAVENOUS

## 2012-09-04 MED ORDER — FENTANYL CITRATE 0.05 MG/ML IJ SOLN: 25.0000 ug | INTRAMUSCULAR | Status: DC | PRN

## 2012-09-04 MED ORDER — MEPERIDINE HCL 25 MG/ML IJ SOLN: 6.2500 mg | INTRAMUSCULAR | Status: DC | PRN

## 2012-09-04 MED ORDER — PROMETHAZINE HCL 25 MG/ML IJ SOLN: 6.2500 mg | INTRAMUSCULAR | Status: DC | PRN

## 2012-09-04 MED ORDER — FENTANYL CITRATE 0.05 MG/ML IJ SOLN
INTRAMUSCULAR | Status: DC | PRN
  Administered 2012-09-04 (×2): 50 ug via INTRAVENOUS

## 2012-09-04 MED ORDER — DOCUSATE SODIUM 100 MG PO CAPS: 100.0000 mg | ORAL_CAPSULE | Freq: Two times a day (BID) | ORAL | Status: DC | PRN

## 2012-09-04 MED ORDER — MIDAZOLAM HCL 2 MG/2ML IJ SOLN: 0.5000 mg | Freq: Once | INTRAMUSCULAR | Status: DC | PRN

## 2012-09-04 MED ORDER — LIDOCAINE HCL (CARDIAC) 20 MG/ML IV SOLN
INTRAVENOUS | Status: DC | PRN
  Administered 2012-09-04: 70 mg via INTRAVENOUS
  Administered 2012-09-04: 30 mg via INTRAVENOUS

## 2012-09-04 MED ORDER — OXYCODONE-ACETAMINOPHEN 5-325 MG PO TABS
2.0000 | ORAL_TABLET | Freq: Four times a day (QID) | ORAL | Status: DC | PRN
  Administered 2012-09-04: 2 via ORAL
  Filled 2012-09-04: qty 2

## 2012-09-04 MED ORDER — CEFAZOLIN SODIUM-DEXTROSE 2-3 GM-% IV SOLR
INTRAVENOUS | Status: DC | PRN
  Administered 2012-09-04: 2 g via INTRAVENOUS

## 2012-09-04 MED ORDER — FERROUS SULFATE 325 (65 FE) MG PO TABS: 325.0000 mg | ORAL_TABLET | Freq: Three times a day (TID) | ORAL | Status: DC

## 2012-09-04 MED ORDER — BUPIVACAINE HCL (PF) 0.5 % IJ SOLN
INTRAMUSCULAR | Status: AC
  Filled 2012-09-04: qty 30

## 2012-09-04 MED ORDER — PROPOFOL 10 MG/ML IV BOLUS
INTRAVENOUS | Status: DC | PRN
  Administered 2012-09-04: 20 mg via INTRAVENOUS
  Administered 2012-09-04: 290 mg via INTRAVENOUS

## 2012-09-04 MED ORDER — FENTANYL CITRATE 0.05 MG/ML IJ SOLN
INTRAMUSCULAR | Status: AC
  Filled 2012-09-04: qty 2

## 2012-09-04 MED ORDER — CEFAZOLIN SODIUM-DEXTROSE 2-3 GM-% IV SOLR
INTRAVENOUS | Status: AC
  Filled 2012-09-04: qty 50

## 2012-09-04 MED ORDER — PROPOFOL 10 MG/ML IV EMUL
INTRAVENOUS | Status: AC
  Filled 2012-09-04: qty 20

## 2012-09-04 MED ORDER — ONDANSETRON HCL 4 MG/2ML IJ SOLN
INTRAMUSCULAR | Status: DC | PRN
  Administered 2012-09-04: 4 mg via INTRAVENOUS

## 2012-09-04 MED ORDER — MIDAZOLAM HCL 5 MG/5ML IJ SOLN
INTRAMUSCULAR | Status: DC | PRN
  Administered 2012-09-04: 2 mg via INTRAVENOUS

## 2012-09-04 MED ORDER — MIDAZOLAM HCL 2 MG/2ML IJ SOLN
INTRAMUSCULAR | Status: AC
  Filled 2012-09-04: qty 2

## 2012-09-04 MED ORDER — OXYCODONE-ACETAMINOPHEN 5-325 MG PO TABS: 2.0000 | ORAL_TABLET | Freq: Four times a day (QID) | ORAL | Status: DC | PRN

## 2012-09-04 SURGICAL SUPPLY — 17 items
CATH ROBINSON RED A/P 16FR (CATHETERS) ×2 IMPLANT
CLOTH BEACON ORANGE TIMEOUT ST (SAFETY) ×2 IMPLANT
CONTAINER PREFILL 10% NBF 60ML (FORM) ×4 IMPLANT
DECANTER SPIKE VIAL GLASS SM (MISCELLANEOUS) ×2 IMPLANT
DRESSING TELFA 8X3 (GAUZE/BANDAGES/DRESSINGS) ×2 IMPLANT
GLOVE BIO SURGEON STRL SZ7 (GLOVE) ×2 IMPLANT
GLOVE BIOGEL PI IND STRL 7.0 (GLOVE) ×1 IMPLANT
GLOVE BIOGEL PI INDICATOR 7.0 (GLOVE) ×1
GOWN STRL REIN XL XLG (GOWN DISPOSABLE) ×4 IMPLANT
NDL SPNL 22GX3.5 QUINCKE BK (NEEDLE) ×1 IMPLANT
NEEDLE SPNL 22GX3.5 QUINCKE BK (NEEDLE) ×2 IMPLANT
PACK VAGINAL MINOR WOMEN LF (CUSTOM PROCEDURE TRAY) ×2 IMPLANT
PAD OB MATERNITY 4.3X12.25 (PERSONAL CARE ITEMS) ×2 IMPLANT
PAD PREP 24X48 CUFFED NSTRL (MISCELLANEOUS) ×2 IMPLANT
SYR CONTROL 10ML LL (SYRINGE) ×2 IMPLANT
TOWEL OR 17X24 6PK STRL BLUE (TOWEL DISPOSABLE) ×4 IMPLANT
WATER STERILE IRR 1000ML POUR (IV SOLUTION) ×2 IMPLANT

## 2012-09-04 NOTE — Transfer of Care (Signed)
Immediate Anesthesia Transfer of Care Note  Patient: April Warner  Procedure(s) Performed: Procedure(s): DILATATION AND CURETTAGE, Hysteroscopy (N/A)  Patient Location: PACU  Anesthesia Type:General  Level of Consciousness: awake, oriented and patient cooperative  Airway & Oxygen Therapy: Patient Spontanous Breathing and Patient connected to nasal cannula oxygen  Post-op Assessment: Report given to PACU RN and Post -op Vital signs reviewed and stable  Post vital signs: Reviewed and stable  Complications: No apparent anesthesia complications

## 2012-09-04 NOTE — Anesthesia Postprocedure Evaluation (Signed)
  Anesthesia Post Note  Patient: April Warner  Procedure(s) Performed: Procedure(s) (LRB): DILATATION AND CURETTAGE, Hysteroscopy (N/A)  Anesthesia type: GA  Patient location: PACU  Post pain: Pain level controlled  Post assessment: Post-op Vital signs reviewed  Last Vitals:  Filed Vitals:   09/04/12 1130  BP: 144/76  Pulse: 66  Temp:   Resp: 16    Post vital signs: Reviewed  Level of consciousness: sedated  Complications: No apparent anesthesia complications

## 2012-09-04 NOTE — Op Note (Signed)
PREOPERATIVE DIAGNOSES:  Dysfuntional uterine bleeding, morbid obesity POSTOPERATIVE DIAGNOSES: The same PROCEDURE: Hysteroscopy, Dilation and Curettage. SURGEON:  Dr. Jaynie Collins   INDICATIONS: 32 y.o. [id number]  here for scheduled surgery for the aforementioned diagnoses.   Risks of surgery were discussed with the patient including but not limited to: bleeding which may require transfusion; infection which may require antibiotics; injury to uterus or surrounding organs; intrauterine scarring which may impair future fertility; need for additional procedures including laparotomy or laparoscopy; and other postoperative/anesthesia complications. Written informed consent was obtained.    FINDINGS:  A 10 week size uterus.  Diffuse proliferative endometrium.  Normal ostia bilaterally.  ANESTHESIA:   General, paracervical block with 30 ml of 0.5% Marcaine INTRAVENOUS FLUIDS:  600 ml of LR FLUID DEFICITS:  30 ml of Glycine ESTIMATED BLOOD LOSS:  25 ml SPECIMENS: Endometrial curettings sent to pathology COMPLICATIONS:  None immediate.  PROCEDURE DETAILS:  The patient received intravenous antibiotics while in the preoperative area.  She was then taken to the operating room where general anesthesia was administered and was found to be adequate.  After an adequate timeout was performed, she was placed in the dorsal lithotomy position and examined; then prepped and draped in the sterile manner.   Her bladder was catheterized for an unmeasured amount of clear, yellow urine. A speculum was then placed in the patient's vagina and a single tooth tenaculum was applied to the anterior lip of the cervix.   A paracervical block using 30 ml of 0.5% Marcaine was administered.  The cervix was sounded to 10 cm and dilated manually with metal dilators to accommodate the 5 mm diagnostic hysteroscope.  Once the cervix was dilated, the hysteroscope was inserted under direct visualization using glycine as a suspension medium.   The uterine cavity was carefully examined with the findings as noted above.   After further careful visualization of the uterine cavity, the hysteroscope was removed under direct visualization.  A sharp curettage was then performed to obtain a moderate amount of endometrial curettings.  The tenaculum was removed from the anterior lip of the cervix and the vaginal speculum was removed after noting good hemostasis.  The patient tolerated the procedure well and was taken to the recovery area awake, extubated and in stable condition.

## 2012-09-04 NOTE — Progress Notes (Signed)
Patient discharged to home with friend. Condition stable. Patient ambulated to bus with friend. No equipment ordered for discharge home.

## 2012-09-04 NOTE — Progress Notes (Signed)
Subjective: Patient reports continued bleeding through the night passing small clots.  Pt also having chest tightness under left breast.  Pt is worried about her father who had a heart attack last night at a [place] prison.  Pt denies SOB, N/V, arm tingling.  Pt is having lower abdominal cramping.  Objective: I have reviewed patient's vital signs, medications, labs and radiology results.  General: alert, cooperative and no distress Resp: clear to auscultation bilaterally Cardio: regular rate and rhythm, S1, S2 normal, no murmur, click, rub or gallop GI: soft, non tender, no rebound Extremities: nontender, no signs of DVT Vaginal Bleeding: moderate   Assessment/Plan: 31 yo female with continued menorrhagia. Pt received 2 shots of depo provera yesterday.  Bleeding continued.  Pt had 3 units of blood less than expected increase in Hgb.  Pt received 2 more units last night and OCP tid added.  US showed lining to be 1.2 cm which is upper limits of normal.   Given that pt is still bleeding would proceed with D & C with IUD placement.  Need to assess endometrium for hyperplasia and malignancy before continuing estrogen.  Dr. Anyanwu is coming on service today and will confirm final plan. Pt's new episode of chest pain--troponin x3 and EKG. Pt NPO   LOS: 1 day    Eleno Weimar H. 09/04/2012, 6:20 AM     

## 2012-09-04 NOTE — Discharge Summary (Signed)
Gynecology Physician Discharge Summary  Patient ID: April Warner MRN: [id number] DOB/AGE: 1980/05/04 32 y.o.  Admit date: 09/03/2012 Discharge date: 09/04/2012  Preoperative Diagnoses: Dysfunctional uterine bleeding, morbid obesity  Procedures: Hysteroscopy, Dilation and Curettage on 09/04/2012 Transfusion with five units of pRBCs on 09/03/2012  Significant Diagnostic Studies: Lab 09/03/12 0405 09/03/12 2040 09/04/12 0658  WBC 9.5 9.4 9.7  HGB 7.2* 8.5* 9.0*  HCT 22.7* 26.1* 27.0*  PLT 189 160 147*   08/12/12 TRANSABDOMINAL AND TRANSVAGINAL ULTRASOUND OF PELVIS Clinical Data: Heavy bleeding and abdominal pain. History of hydrosalpinx. Comparison: 04/01/2012 Findings: Uterus: The uterus is anteverted and measures 10 x 5.8 x 6.3 cm. No focal myometrial mass lesions. The cervix is unremarkable. Endometrium: Endometrial stripe thickness is upper limits of normal at 1.2 cm. No endometrial fluid collection is demonstrated. Right ovary: Limited visualization of the right ovary. As visualized, the right ovary measures about 2.1 x 1.7 x 1.8 cm. No abnormal adnexal masses are appreciated. Left ovary: Left ovary measures 1.4 x 1.2 x 3.1 cm. Tubular fluid filled structure demonstrated measuring 0.6 x 0.7 cm in transverse dimension. This is consistent with mild hydrosalpinx and appears  stable since previous study. Other findings: Minimal free fluid in the pelvis. IMPRESSION: Uterus and ovaries are unremarkable. Mild left hydrosalpinx, stable since previous study. Minimal free fluid in the pelvis.    Hospital Course:  April Warner is a 32 y.o. [phone]  admitted for DUB in the setting of morbid obesity. She had been admitted for this in the past and required transfusions for symptomatic anemia.  She continued to bleed despite receiving Depo Provera 300 mg IM, and other hormonal therapy so she was scheduled for hysteroscopy, D&C.   Of note, she also received five units of pRBCs and her hemoglobin was  only noted to increase from 7.2 to 9.0.  She underwent the procedures as mentioned above, her operation was uncomplicated. For further details about surgery, please refer to the operative report. Patient had an uncomplicated postoperative course. By time of discharge, her pain was controlled on oral pain medications; she was ambulating, voiding without difficulty, tolerating regular diet and passing flatus. Her bleeding was noted to be moderate, but decreased from admission.  She was deemed stable for discharge to home with outpatient follow up.   Discharge Exam: Blood pressure 129/75, pulse 59, temperature 98 F (36.7 C), temperature source Oral, resp. rate 20, height 5\' 9"  (1.753 m), weight 431 lb (195.5 kg), last menstrual period 06/29/2012, SpO2 97.00%. General appearance: alert and no distress Resp: clear to auscultation bilaterally Cardio: regular rate and rhythm GI: soft, non-tender; bowel sounds normal; no masses,  no organomegaly Pelvic: Deferred. Moderate amount of blood on peripad. Extremities: extremities normal, atraumatic, no cyanosis or edema and Homans sign is negative, no sign of DVT  Discharged Condition: Stable  Disposition: 01-Home or Self Care   Future Appointments Provider Department Dept Phone   10/03/2012 11:00 AM Tereso Newcomer, MD [facility] [phone]       Medication List    STOP taking these medications       megestrol 40 MG tablet  Commonly known as:  MEGACE      TAKE these medications       docusate sodium 100 MG capsule  Commonly known as:  COLACE  Take 1 capsule (100 mg total) by mouth 2 (two) times daily as needed for constipation.     ferrous sulfate 325 (65 FE) MG tablet  Take 1  tablet (325 mg total) by mouth 3 (three) times daily with meals.     ferrous sulfate 325 (65 FE) MG tablet  Take 325 mg by mouth 2 (two) times daily.     Oxycodone HCl 10 MG Tabs  Take 1 tablet (10 mg total) by mouth every 4 (four) hours as needed.            Follow-up Information   Follow up with Tereso Newcomer, MD On 10/03/2012. (11 am for postoperative appointment.  Call clinic or go to MAU for any concerns or if symptoms worsen)    Specialty:  Obstetrics and Gynecology   Contact information:   [address] [place] [address] [phone]       Signed:  Jaynie Collins, MD, FACOG Attending Obstetrician & Gynecologist Faculty Practice, [facility] [place]

## 2012-09-04 NOTE — H&P (View-Only) (Signed)
Subjective: Patient reports continued bleeding through the night passing small clots.  Pt also having chest tightness under left breast.  Pt is worried about her father who had a heart attack last night at a [place] prison.  Pt denies SOB, N/V, arm tingling.  Pt is having lower abdominal cramping.  Objective: I have reviewed patient's vital signs, medications, labs and radiology results.  General: alert, cooperative and no distress Resp: clear to auscultation bilaterally Cardio: regular rate and rhythm, S1, S2 normal, no murmur, click, rub or gallop GI: soft, non tender, no rebound Extremities: nontender, no signs of DVT Vaginal Bleeding: moderate   Assessment/Plan: 32 yo female with continued menorrhagia. Pt received 2 shots of depo provera yesterday.  Bleeding continued.  Pt had 3 units of blood less than expected increase in Hgb.  Pt received 2 more units last night and OCP tid added.  US showed lining to be 1.2 cm which is upper limits of normal.   Given that pt is still bleeding would proceed with D & C with IUD placement.  Need to assess endometrium for hyperplasia and malignancy before continuing estrogen.  Dr. Macon Large is coming on service today and will confirm final plan. Pt's new episode of chest pain--troponin x3 and EKG. Pt NPO   LOS: 1 day    LEGGETT,KELLY H. 09/04/2012, 6:20 AM

## 2012-09-04 NOTE — Anesthesia Preprocedure Evaluation (Addendum)
Anesthesia Evaluation  Patient identified by MRN, date of birth, ID band Patient awake    Reviewed: Allergy & Precautions, H&P , Patient's Chart, lab work & pertinent test results, reviewed documented beta blocker date and time   History of Anesthesia Complications Negative for: history of anesthetic complications  Airway Mallampati: III TM Distance: >3 FB Neck ROM: full    Dental no notable dental hx.    Pulmonary neg pulmonary ROS,  breath sounds clear to auscultation  Pulmonary exam normal       Cardiovascular Exercise Tolerance: Good hypertension, negative cardio ROS  Rhythm:regular Rate:Normal     Neuro/Psych negative neurological ROS  negative psych ROS   GI/Hepatic negative GI ROS, Neg liver ROS,   Endo/Other  negative endocrine ROSMorbid obesity  Renal/GU negative Renal ROS     Musculoskeletal   Abdominal   Peds  Hematology negative hematology ROS (+)   Anesthesia Other Findings Hypertension     Obesity        Knee pain     Arthritis   bil knees    Reproductive/Obstetrics negative OB ROS                           Anesthesia Physical Anesthesia Plan  ASA: III  Anesthesia Plan: General LMA   Post-op Pain Management:    Induction:   Airway Management Planned:   Additional Equipment:   Intra-op Plan:   Post-operative Plan:   Informed Consent: I have reviewed the patients History and Physical, chart, labs and discussed the procedure including the risks, benefits and alternatives for the proposed anesthesia with the patient or authorized representative who has indicated his/her understanding and acceptance.   Dental Advisory Given  Plan Discussed with: CRNA, Surgeon and Anesthesiologist  Anesthesia Plan Comments:        recent complaint of chest pain with mourning for her father who had a heart attack.  EKG and enzymes negative.  Will counsel her on options and  allow her to seek further medical workup if she prefers but feel safe to proceed at this time. Anesthesia Quick Evaluation

## 2012-09-04 NOTE — Interval H&P Note (Signed)
History and Physical Interval Note 09/04/2012 9:35 AM  April Warner has a history of DUB requiring multiple transfusions and has not responded to large doses of progesterone.  Dr. Penne Lash discussed having a D&C, IUD placement with the patient this morning.  However, patient reported having chest pain.  EKG was done which was normal, Troponin was 0.30.  These studies were reviewed by Dr. Brayton Caves (Anesthesiologist) and he felt they were normal and the patient can undergo surgery today.  I met and talked to the patient today; she declines Mirena IUD placement as she had a bad experience with the Mirena in the past.  She did agree to Hysteroscopy, D&C as a surgical intervention.  Risks of surgery were discussed with the patient including but not limited to: bleeding which may require transfusion; infection which may require antibiotics; injury to uterus or surrounding organs; intrauterine scarring which may impair future fertility; need for additional procedures including laparotomy or laparoscopy; and other postoperative/anesthesia complications. Written informed consent was obtained. The patient's history has been reviewed, patient examined, no change in status, stable for surgery.  I have reviewed the patient's chart and labs.  Questions were answered to the patient's satisfaction.  To OR when ready.  Jaynie Collins, MD, FACOG Attending Obstetrician & Gynecologist Faculty Practice, [facility] [place]

## 2012-09-05 ENCOUNTER — Encounter: Admitting: Medical

## 2012-09-05 ENCOUNTER — Encounter (HOSPITAL_COMMUNITY): Payer: Self-pay | Admitting: Obstetrics & Gynecology

## 2012-09-05 ENCOUNTER — Other Ambulatory Visit: Payer: Self-pay | Admitting: Orthopaedic Surgery

## 2012-09-05 DIAGNOSIS — M25561 Pain in right knee: Secondary | ICD-10-CM

## 2012-09-05 LAB — TYPE AND SCREEN
Antibody Screen: NEGATIVE
Unit division: 0
Unit division: 0
Unit division: 0

## 2012-09-08 NOTE — H&P (Signed)
Attestation of Attending Supervision of Advanced Practitioner (CNM/NP): Evaluation and management procedures were performed by the Advanced Practitioner under my supervision and collaboration. I have reviewed the Advanced Practitioner's note and chart, and I agree with the management and plan.  LEGGETT,KELLY H. 4:26 PM   

## 2012-09-09 ENCOUNTER — Telehealth: Payer: Self-pay

## 2012-09-09 NOTE — Telephone Encounter (Signed)
Message copied by Faythe Casa on Mon Sep 09, 2012  4:38 PM ------      Message from: Jaynie Collins A      Created: Thu Sep 05, 2012  1:38 PM       Benign endometrial biopsy. Please call to inform patient of results.  Will discuss further management at postop visit. ------

## 2012-09-09 NOTE — Telephone Encounter (Signed)
Called and left message to return call to the clinics.  

## 2012-09-10 NOTE — Telephone Encounter (Signed)
Called patient, no answer- left message that we are trying to get in touch with her with some information and to call us back at the clinics 

## 2012-09-11 ENCOUNTER — Encounter: Payer: Self-pay | Admitting: General Practice

## 2012-09-11 NOTE — Telephone Encounter (Signed)
Called patient, no answer- left message to call us back at the clinics. Will send patient letter

## 2012-10-03 ENCOUNTER — Encounter: Admitting: Obstetrics & Gynecology

## 2012-10-16 ENCOUNTER — Encounter: Admitting: Obstetrics & Gynecology

## 2013-01-11 ENCOUNTER — Emergency Department (HOSPITAL_COMMUNITY): Payer: Self-pay

## 2013-01-11 ENCOUNTER — Emergency Department (HOSPITAL_COMMUNITY)
Admission: EM | Admit: 2013-01-11 | Discharge: 2013-01-11 | Disposition: A | Discharge status: Home / Self Care | Payer: Self-pay | Attending: Emergency Medicine | Admitting: Emergency Medicine

## 2013-01-11 ENCOUNTER — Encounter (HOSPITAL_COMMUNITY): Payer: Self-pay | Admitting: Emergency Medicine

## 2013-01-11 DIAGNOSIS — M1710 Unilateral primary osteoarthritis, unspecified knee: Secondary | ICD-10-CM | POA: Insufficient documentation

## 2013-01-11 DIAGNOSIS — I10 Essential (primary) hypertension: Secondary | ICD-10-CM | POA: Insufficient documentation

## 2013-01-11 DIAGNOSIS — R05 Cough: Secondary | ICD-10-CM

## 2013-01-11 DIAGNOSIS — E669 Obesity, unspecified: Secondary | ICD-10-CM | POA: Insufficient documentation

## 2013-01-11 DIAGNOSIS — R509 Fever, unspecified: Secondary | ICD-10-CM | POA: Insufficient documentation

## 2013-01-11 DIAGNOSIS — J029 Acute pharyngitis, unspecified: Secondary | ICD-10-CM | POA: Insufficient documentation

## 2013-01-11 DIAGNOSIS — Z7982 Long term (current) use of aspirin: Secondary | ICD-10-CM | POA: Insufficient documentation

## 2013-01-11 DIAGNOSIS — F17200 Nicotine dependence, unspecified, uncomplicated: Secondary | ICD-10-CM | POA: Insufficient documentation

## 2013-01-11 DIAGNOSIS — M546 Pain in thoracic spine: Secondary | ICD-10-CM | POA: Insufficient documentation

## 2013-01-11 DIAGNOSIS — R059 Cough, unspecified: Secondary | ICD-10-CM | POA: Insufficient documentation

## 2013-01-11 MED ORDER — OXYCODONE-ACETAMINOPHEN 5-325 MG PO TABS
2.0000 | ORAL_TABLET | Freq: Once | ORAL | Status: AC
  Administered 2013-01-11: 2 via ORAL
  Filled 2013-01-11: qty 2

## 2013-01-11 MED ORDER — IBUPROFEN 600 MG PO TABS: 600.0000 mg | ORAL_TABLET | Freq: Four times a day (QID) | ORAL | Status: DC | PRN

## 2013-01-11 MED ORDER — HYDROCODONE-HOMATROPINE 5-1.5 MG/5ML PO SYRP: 5.0000 mL | ORAL_SOLUTION | Freq: Four times a day (QID) | ORAL | Status: DC | PRN

## 2013-01-11 NOTE — ED Provider Notes (Signed)
Medical screening examination/treatment/procedure(s) were performed by non-physician practitioner and as supervising physician I was immediately available for consultation/collaboration.    Mikel Hardgrove M Kiyoko Mcguirt, MD 01/11/13 0710 

## 2013-01-11 NOTE — ED Notes (Signed)
No answer

## 2013-01-11 NOTE — ED Notes (Signed)
Humes, PA at bedside.  

## 2013-01-11 NOTE — ED Notes (Signed)
Pt. reports persistent productive cough onset yesterday , pain at upper back and left lateral ribcage when coughing . Respirations unlabored/ denies fever or chills.

## 2013-01-11 NOTE — ED Provider Notes (Signed)
CSN: [id number]     Arrival date & time 01/11/13  0116 History   First MD Initiated Contact with Patient 01/11/13 [phone]     Chief Complaint  Patient presents with  . Cough   (Consider location/radiation/quality/duration/timing/severity/associated sxs/prior Treatment) HPI Comments: 32 year old female presents for intermittent productive, persistent cough. Symptoms ongoing for 3 days. Patient endorses associated left posterior back discomfort which is worse with prolonged coughing. She has tried Aleve for symptoms without relief. She endorses associated fever responding to NSAIDs. Patient denies syncope, chest pain, hemoptysis, nasal congestion, rhinorrhea, recent surgeries/hospitalizations, prolonged travel, and hormone replacement therapy or OCPs.  Patient is a 32 y.o. female presenting with cough. The history is provided by the patient. No language interpreter was used.  Cough Cough characteristics:  Dry and productive Sputum characteristics:  Yellow Severity:  Moderate Onset quality:  Gradual Duration:  3 days Timing:  Intermittent Progression:  Worsening Chronicity:  New Relieved by:  Nothing Exacerbated by: nighttime. Ineffective treatments: NSAIDs. Associated symptoms: fever and sore throat   Associated symptoms: no rhinorrhea, no shortness of breath and no sinus congestion     Past Medical History  Diagnosis Date  . Hypertension   . Obesity   . Knee pain   . Arthritis     bil knees   Past Surgical History  Procedure Laterality Date  . Cholecystectomy    . Cesarean section    . Knee surgery    . Dilation and curettage of uterus N/A 09/04/2012    Procedure: DILATATION AND CURETTAGE, Hysteroscopy;  Surgeon: Tereso Newcomer, MD;  Location: [facility];  Service: Gynecology;  Laterality: N/A;   Family History  Problem Relation Age of Onset  . Diabetes Maternal Aunt   . Cancer Maternal Aunt   . Diabetes Maternal Grandmother   . Cancer Paternal Grandmother    History   Substance Use Topics  . Smoking status: Current Every Day Smoker -- 0.50 packs/day    Types: Cigarettes  . Smokeless tobacco: Not on file  . Alcohol Use: No   OB History   Grav Para Term Preterm Abortions TAB SAB Ect Mult Living   3 3 2 1  0 0 0 0 0 2     Review of Systems  Constitutional: Positive for fever.  HENT: Positive for sore throat. Negative for congestion, rhinorrhea and trouble swallowing.   Respiratory: Positive for cough. Negative for shortness of breath.   Musculoskeletal: Positive for back pain.  Neurological: Negative for syncope.  All other systems reviewed and are negative.    Allergies  Toradol  Home Medications   Current Outpatient Rx  Name  Route  Sig  Dispense  Refill  . aspirin 81 MG chewable tablet   Oral   Chew 81 mg by mouth daily.         Marland Kitchen BACLOFEN PO   Oral   Take 1 tablet by mouth once.         Marland Kitchen HYDROcodone-homatropine (HYCODAN) 5-1.5 MG/5ML syrup   Oral   Take 5 mLs by mouth every 6 (six) hours as needed for cough.   120 mL   0   . ibuprofen (ADVIL,MOTRIN) 600 MG tablet   Oral   Take 1 tablet (600 mg total) by mouth every 6 (six) hours as needed.   30 tablet   0    BP 130/74  Pulse 98  Temp(Src) 99.6 F (37.6 C) (Oral)  Resp 20  Wt 419 lb (190.057 kg)  SpO2 97%  LMP  01/11/2013  Physical Exam  Nursing note and vitals reviewed. Constitutional: She is oriented to person, place, and time. She appears well-developed and well-nourished. No distress.  Patient sleeping comfortably and snoring in exam room bed.  HENT:  Head: Normocephalic and atraumatic.  Right Ear: External ear normal.  Left Ear: External ear normal.  Nose: Nose normal.  Mouth/Throat: Uvula is midline, oropharynx is clear and moist and mucous membranes are normal.  Eyes: Conjunctivae and EOM are normal. Pupils are equal, round, and reactive to light. No scleral icterus.  Neck: Normal range of motion. Neck supple.  Cardiovascular: Normal rate, regular  rhythm and normal heart sounds.   Pulmonary/Chest: Effort normal. No stridor. No respiratory distress. She has no wheezes. She has no rales.  No retractions or accessory muscle use.  Musculoskeletal: Normal range of motion.  Lymphadenopathy:    She has no cervical adenopathy.  Neurological: She is alert and oriented to person, place, and time.  Skin: Skin is warm and dry. No rash noted. She is not diaphoretic. No erythema. No pallor.  Psychiatric: She has a normal mood and affect. Her behavior is normal.    ED Course  Procedures (including critical care time) Labs Review Labs Reviewed - No data to display Imaging Review Dg Chest 2 View  01/11/2013   CLINICAL DATA:  Cough  EXAM: CHEST  2 VIEW  COMPARISON:  01/12/2011  FINDINGS: When accounting for underpenetration, no consolidation, edema, effusion, or pneumothorax is suspected. There may be mild bronchitic changes. Normal heart size for inspiratory volume.  IMPRESSION: Negative for pneumonia.   Electronically Signed   By: Tiburcio Pea M.D.   On: 01/11/2013 02:10    EKG Interpretation   None       MDM   1. Cough    Uncomplicated cough with, likely, associated costochondritis. Patient well and nontoxic appearing, hemodynamically stable, and afebrile. On initial presentation she is sleeping soundly in the exam room bed and snoring in no acute respiratory distress. Lungs clear bilaterally. Chest expansion is symmetric. No retractions or accessory muscle use appreciated. Oropharynx clear uvula midline. Patient tolerating secretions without difficulty. Chest x-ray negative for pneumonia. Doubt atypically presenting PE as patient is PERC negative without tachycardia, tachypnea, dyspnea, or hypoxia. Symptoms most consistent with viral illness. Patient stable and appropriate for discharge with instructions for ibuprofen for pain control and Hycodan as needed for severe cough. Return precautions provided and patient agreeable to plan with no  unaddressed concerns.   Filed Vitals:   01/11/13 0123 01/11/13 0417  BP: 130/74 131/70  Pulse: 98 90  Temp: 99.6 F (37.6 C) 99.2 F (37.3 C)  TempSrc: Oral Oral  Resp: 20 22  Weight: 419 lb (190.057 kg)   SpO2: 97% 98%     Antony Madura, PA-C 01/11/13 0421

## 2013-03-17 ENCOUNTER — Emergency Department (HOSPITAL_COMMUNITY)
Admission: EM | Admit: 2013-03-17 | Discharge: 2013-03-17 | Disposition: A | Discharge status: Home / Self Care | Attending: Emergency Medicine | Admitting: Emergency Medicine

## 2013-03-17 ENCOUNTER — Encounter (HOSPITAL_COMMUNITY): Payer: Self-pay | Admitting: Emergency Medicine

## 2013-03-17 DIAGNOSIS — Z9889 Other specified postprocedural states: Secondary | ICD-10-CM | POA: Insufficient documentation

## 2013-03-17 DIAGNOSIS — Z87828 Personal history of other (healed) physical injury and trauma: Secondary | ICD-10-CM | POA: Insufficient documentation

## 2013-03-17 DIAGNOSIS — M25569 Pain in unspecified knee: Secondary | ICD-10-CM

## 2013-03-17 DIAGNOSIS — G8929 Other chronic pain: Secondary | ICD-10-CM | POA: Insufficient documentation

## 2013-03-17 DIAGNOSIS — F17200 Nicotine dependence, unspecified, uncomplicated: Secondary | ICD-10-CM | POA: Insufficient documentation

## 2013-03-17 DIAGNOSIS — E669 Obesity, unspecified: Secondary | ICD-10-CM | POA: Insufficient documentation

## 2013-03-17 DIAGNOSIS — Z8739 Personal history of other diseases of the musculoskeletal system and connective tissue: Secondary | ICD-10-CM | POA: Insufficient documentation

## 2013-03-17 DIAGNOSIS — I10 Essential (primary) hypertension: Secondary | ICD-10-CM | POA: Insufficient documentation

## 2013-03-17 MED ORDER — OXYCODONE-ACETAMINOPHEN 5-325 MG PO TABS
2.0000 | ORAL_TABLET | Freq: Four times a day (QID) | ORAL | Status: DC | PRN
Start: 2013-03-17 — End: 2013-03-26

## 2013-03-17 MED ORDER — OXYCODONE-ACETAMINOPHEN 5-325 MG PO TABS
2.0000 | ORAL_TABLET | Freq: Once | ORAL | Status: AC
  Administered 2013-03-17: 2 via ORAL
  Filled 2013-03-17: qty 2

## 2013-03-17 NOTE — Discharge Instructions (Signed)
°Emergency Department Resource Guide °1) Find a Doctor and Pay Out of Pocket °Although you won't have to find out who is covered by your insurance plan, it is a good idea to ask around and get recommendations. You will then need to call the office and see if the doctor you have chosen will accept you as a new patient and what types of options they offer for patients who are self-pay. Some doctors offer discounts or will set up payment plans for their patients who do not have insurance, but you will need to ask so you aren't surprised when you get to your appointment. ° °2) Contact Your Local Health Department °Not all health departments have doctors that can see patients for sick visits, but many do, so it is worth a call to see if yours does. If you don't know where your local health department is, you can check in your phone book. The CDC also has a tool to help you locate your state's health department, and many state websites also have listings of all of their local health departments. ° °3) Find a Walk-in Clinic °If your illness is not likely to be very severe or complicated, you may want to try a walk in clinic. These are popping up all over the country in pharmacies, drugstores, and shopping centers. They're usually staffed by nurse practitioners or physician assistants that have been trained to treat common illnesses and complaints. They're usually fairly quick and inexpensive. However, if you have serious medical issues or chronic medical problems, these are probably not your best option. ° °No Primary Care Doctor: °- Call [facility] at  832-8000 - they can help you locate a primary care doctor that  accepts your insurance, provides certain services, etc. °- Physician Referral Service- 1-800-533-3463 ° °Chronic Pain Problems: °Organization         Address  Phone   Notes  °[facility]  (336) 297-2271 Patients need to be referred by their primary care doctor.  ° °Medication  Assistance: °Organization         Address  Phone   Notes  °[place] [facility] [address] °[place], [place] [address] (336) 641-8030 --Must be a resident of [place] °-- Must have NO insurance coverage whatsoever (no Medicaid/ Medicare, etc.) °-- The pt. MUST have a primary care doctor that directs their care regularly and follows them in the community °  °[facility]  (866) 331-1348   °[employer] Way  (888) 892-1162   ° °Agencies that provide inexpensive medical care: °Organization         Address  Phone   Notes  °[facility]  (336) 832-8035   ° Internal Medicine    (336) 832-7272   °[facility] [address] °[place], [place] [address] (336) 832-4777   °[facility] [address], °[place] (336) 271-4999   °[facility]    (336) 373-0678   °[facility]    (336) 272-1050   °[facility] ° [address], [place] Phone:  (336) 832-4444, Fax:  (336) 832-4440 Hours of Operation:  9 am - 6 pm, M-F.  Also accepts Medicaid/Medicare and self-pay.  °[facility] ° [address], [place] Phone: (336) 832-3150, Fax: (336) 832-3151. Hours of Operation:  8:30 am - 5:30 pm, M-F.  Also accepts Medicaid and self-pay.  °[facility] [place] [address], [place] Phone: (336) 878-6027   °[facility] [address], [place], [place] (336)723-1848, Ext. 123 Mondays & Thursdays: 7-9 AM.  First 15 patients are seen on a first come, first serve basis. °  ° °Medicaid-accepting [place] Providers: ° °Organization         Address  Phone   Notes  °[facility] [address], [place] (336) 641-2100 Also accepts self-pay patients.  °[facility] [address], [place] ° (336) 856-9996   °[facility] [address], [place]  (336) 288-8857   °[facility] [address] [place] [address], [place] (336) 299-7000   °Veita Bland [address], [place]  ° (336) 373-1557 Only accepts [place] Access Medicaid patients after they have their name applied to their card.  ° °Self-Pay (no insurance) in [place]: ° °Organization         Address  Phone   Notes  °Sickle Cell Patients, [facility] [address], [place] (336) 832-1970   °[facility] [address], [place] (336) 832-4400   °[facility] [place] [facility] ° [address], [place] (336) 992-4800   °[facility]/Dr. Osei-Bonsu ° [address] [place] [address], [place] or [address], [place] (336) 841-8500 Phone number for both [place] and [place] locations is the same.  °[facility] [address], [place] (336) 299-0000   °[facility] [address] [place] [address], [place] or [address] (336) 852-7530 °(336) 878-2260   °[facility] [address], [place] (336) 350-1642, phone; (336) 294-5005, fax Sees patients 1st and 3rd Saturday of every month.  Must not qualify for public or private insurance (i.e. Medicaid, Medicare, [place] Health Choice, Veterans' Benefits) • Household income should be no more than 200% of the poverty level •The clinic cannot treat you if you are pregnant or think you are pregnant • Sexually transmitted diseases are not treated at the clinic.  ° ° °Dental Care: °Organization         Address  Phone  Notes  °[place] [facility] [address], [place] (336) 641-6152 Accepts children up to age 21 who are enrolled in Medicaid or [place] Health Choice; pregnant women with a Medicaid card; and children who have applied for Medicaid or [place] Health Choice, but were declined, whose parents can pay a reduced fee at time of service.  °[place]  [facility] [place]  [address], [place] (336) 641-7733 Accepts children up to age 21 who are enrolled in Medicaid or [place] Health Choice; pregnant women with a Medicaid card; and children who have applied for Medicaid or [place] Health Choice, but were declined, whose parents can pay a reduced fee at time of service.  °[facility] ° [address],  (336) 641-4533 Patients are seen by appointment only. Walk-ins are not accepted. [facility] will see patients 18 years of age and older. °Monday - Tuesday (8am-5pm) °Most Wednesdays (8:30-5pm) °$30 per visit, cash only  °[facility] ° [address], [place] (336) 641-4533 Patients are seen by appointment only. Walk-ins are not accepted. [facility] will see patients 18 years of age and older. °One   Wednesday Evening (Monthly: Volunteer Based).  $30 per visit, cash only  °[facility]  (919) 537-3737 for adults; Children under age 4, call Graduate Pediatric Dentistry at (919) [phone]. Children aged 4-14, please call (919) [phone] to request a pediatric application. ° Dental services are provided in all areas of dental care including fillings, crowns and bridges, complete and partial dentures, implants, gum treatment, root canals, and extractions. Preventive care is also provided. Treatment is provided to both adults and children. °Patients are selected via a lottery and there is often a waiting list. °  °[facility] [address], °[place] ° (336) 763-8833 [email or web address] °  °[facility] [address], [place], [place] (336)723-1848, Ext. 123 Second and Fourth Thursday of each month, opens at 6:30 AM; Clinic ends at 9 AM.  Patients are seen on a first-come first-served basis, and a limited number are seen during each clinic.  ° °[facility] ° [address], [place], [place] (336) 723-7904    Eligibility Requirements °You must have lived in [place], [place], or [place] counties for at least the last three months. °  You cannot be eligible for state or federal sponsored healthcare insurance, including Veterans Administration, Medicaid, or Medicare. °  You generally cannot be eligible for healthcare insurance through your employer.  °  How to apply: °Eligibility screenings are held every Tuesday and Wednesday afternoon from 1:00 pm until 4:00 pm. You do not need an appointment for the interview!  °[facility] [address], [place], [place] 336-631-2330   °[facility] [facility]  336-342-8273   °[facility]  336-703-3100   °[facility]  336-570-6415   ° °Behavioral Health Resources in the Community: °Intensive Outpatient Programs °Organization         Address  Phone  Notes  °[facility] [address], [place], [place] 336-878-6098   °[facility] [address], [place], [place] 336-832-9800   °[facility] [address], [place], [place] ° 336-882-2125   °[facility] [address],  °[place], [place] 1-800-853-5163 or 336-641-4981   °Substance Abuse Resources °Organization         Address  Phone  Notes  °[facility]  336-882-2125   °[facility]  336-784-9470   °[facility]  336-285-9073   °[facility]  336-845-3988   °Residential & Outpatient Substance Abuse Program  1-800-659-3381   °Psychological Services °Organization         Address  Phone  Notes  ° Health  336- 832-9600   °[facility]  336- 378-7881   °[facility] [address], [place] 1-800-853-5163 or 336-641-4981   ° °Mobile Crisis Teams °Organization         Address  Phone  Notes  °[facility], Mobile Crisis Care Unit  1-877-626-1772   °Assertive °[facility] ° [address]  [place], [place] 336-834-9664   °Sharon DeEsch [address] °[place] 336-554-5454   ° °Self-Help/Support Groups °Organization         Address  Phone             Notes  °[facility] [place] - variety of support groups  336- 373-1402 Call for more information  °Narcotics Anonymous (NA), [facility] [address], °[place]  2 meetings at this location  ° °  Residential Treatment Programs °Organization         Address  Phone  Notes  °[facility] [address],    °[place]  1-866-801-8205   °[facility] ° [address], [place], [place] 704-293-8524   °[facility] [address], [place] 336-845-3988 Admissions: 8am-3pm M-F  °[facility] [address]    °[place], [place] 336-841-1104   °[facility] [address], [place], [place] 336-379-7146   °[facility] [address]  °[place], [place] 336-285-9073   °[facility] - Intensive Outpatient [address], [place], [place] 336-852-3033   °[facility] [address]  °[place], [place] 1-877-615-2722 or 336-784-9470   °[facility] [address] [place], [place] 336-227-7417 Accepts Medicaid  °[facility] [address]  °[place] 1-800-659-3381 Substance Abuse/Addiction Treatment  ° °[place] Behavioral Health Resources °Organization         Address  Phone  Notes  °[facility]  (888) 581-9988   °Julie Brannon, PhD [address] [place], [place]   (336) 349-5553 or (336) 951-0000   °[facility]   [address] °[place], [place] (336) 349-4454   °[facility] [address], [place], [place] (336) 342-8316 Insurance/Medicaid/sponsorship through [facility]  °[facility] [address]                                    [place], [place] (336) 342-8316 Therapy/tele-psych/case    °[facility] [address]  ° [place],  (336) 349-2233    °Dr. Arfeen  (336) 349-4544   °[facility] [place]  [employer] [place] [facility] 1) [address],  °2) [address], [place] °3)  [address], [place] (336) 349-3220 °(336) 342-7768 ° °(336) 342-8140   °[place] [facility] (336) 342-1394 or (336) 342-3537 (After Hours)    ° ° °

## 2013-03-17 NOTE — Progress Notes (Signed)
[facility] provided pt with a list of primary care resources and a [facility] Orange Card application to help patient establish primary care.  °

## 2013-03-17 NOTE — ED Notes (Signed)
Pt c/o right knee pain; chronic x 2 years
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
## 2013-03-20 NOTE — ED Provider Notes (Signed)
Medical screening examination/treatment/procedure(s) were performed by non-physician practitioner and as supervising physician I was immediately available for consultation/collaboration.  EKG Interpretation   None         David H Yao, MD 03/20/13 2321 

## 2013-03-26 ENCOUNTER — Emergency Department (HOSPITAL_COMMUNITY)
Admission: EM | Admit: 2013-03-26 | Discharge: 2013-03-26 | Disposition: A | Discharge status: Home / Self Care | Payer: Self-pay | Attending: Emergency Medicine | Admitting: Emergency Medicine

## 2013-03-26 ENCOUNTER — Emergency Department (HOSPITAL_COMMUNITY): Payer: Self-pay

## 2013-03-26 ENCOUNTER — Encounter (HOSPITAL_COMMUNITY): Payer: Self-pay | Admitting: Emergency Medicine

## 2013-03-26 DIAGNOSIS — M25569 Pain in unspecified knee: Secondary | ICD-10-CM | POA: Insufficient documentation

## 2013-03-26 DIAGNOSIS — F17200 Nicotine dependence, unspecified, uncomplicated: Secondary | ICD-10-CM | POA: Insufficient documentation

## 2013-03-26 DIAGNOSIS — E669 Obesity, unspecified: Secondary | ICD-10-CM | POA: Insufficient documentation

## 2013-03-26 DIAGNOSIS — Z8739 Personal history of other diseases of the musculoskeletal system and connective tissue: Secondary | ICD-10-CM | POA: Insufficient documentation

## 2013-03-26 DIAGNOSIS — I10 Essential (primary) hypertension: Secondary | ICD-10-CM | POA: Insufficient documentation

## 2013-03-26 DIAGNOSIS — G8929 Other chronic pain: Secondary | ICD-10-CM | POA: Insufficient documentation

## 2013-03-26 MED ORDER — HYDROCODONE-ACETAMINOPHEN 5-325 MG PO TABS
2.0000 | ORAL_TABLET | Freq: Once | ORAL | Status: AC
  Administered 2013-03-26: 2 via ORAL
  Filled 2013-03-26: qty 2

## 2013-03-26 MED ORDER — HYDROCODONE-ACETAMINOPHEN 5-325 MG PO TABS: 2.0000 | ORAL_TABLET | ORAL | Status: DC | PRN

## 2013-03-26 NOTE — Discharge Instructions (Signed)
Knee Pain Knee pain can be a result of an injury or other medical conditions. Treatment will depend on the cause of your pain. HOME CARE  Only take medicine as told by your doctor.  Keep a healthy weight. Being overweight can make the knee hurt more.  Stretch before exercising or playing sports.  If there is constant knee pain, change the way you exercise. Ask your doctor for advice.  Make sure shoes fit well. Choose the right shoe for the sport or activity.  Protect your knees. Wear kneepads if needed.  Rest when you are tired. GET HELP RIGHT AWAY IF:   Your knee pain does not stop.  Your knee pain does not get better.  Your knee joint feels hot to the touch.  You have a fever. MAKE SURE YOU:   Understand these instructions.  Will watch this condition.  Will get help right away if you are not doing well or get worse. Document Released: 04/07/2008 Document Revised: 04/03/2011 Document Reviewed: 04/07/2008 [facility] Patient Information 2014 [place], [place].   Follow up with orthopedics Rest, elevation, ice and compression of right knee Wear ace wrap Ibuprofen for pain or discomfort Only use hydrocodone for mod-severe pain

## 2013-03-26 NOTE — ED Notes (Signed)
Pt c/o right knee pain; chronic pain x 3 years

## 2013-03-26 NOTE — ED Provider Notes (Signed)
CSN: [id number]     Arrival date & time 03/26/13  1040 History   This chart was scribed for Irish EldersKelly Braedan Meuth by Ladona Ridgelaylor Day, ED scribe. This patient was seen in room WTR9/WTR9 and the patient's care was started at 1040.   Chief Complaint  Patient presents with  . Knee Pain   The history is provided by the patient. No language interpreter was used.   HPI Comments: April Warner is a 33 y.o. female who presents to the Emergency Department complaining of constant, gradually worsened chronic right knee pain ongoing for x3 years, no new acute injury. She reports pain radiates down to her right foot. She tried ace wrap, heating pad w/no relief. She denies any re-injury of her right knee and that over the past few weeks her knee has gradually worsened w/pain and had no new acute injury. She has a long standing hx of this problem w/multiple recent workups. She has been to [facility] for this problem but unable to f/u again b/c she is unable to pay for it. She also reports previously had cortisone shots w/no relief of her knee pain. She states that she does not currently have crutches to take weight off of her knee. She had knee surgery twice over the past 20 years, once for dislocated patella. She states at this time would be okay to have a referral for somebody to see her. She reports diffic She has an appointment tomorrow "to get an orange card" and apply for medicaid or possibly disability.    She states Dr. Delane GingerGill told her x7 months ago that pain in the back of her right knee could possible be from  Blood clot. She states the pain the pain is usually over her knee cap and seldomly in her calf. She denies any acute SOB. She reports intermittent lower leg swelling. She is a smoker  Past Medical History  Diagnosis Date  . Hypertension   . Obesity   . Knee pain   . Arthritis     bil knees   Past Surgical History  Procedure Laterality Date  . Cholecystectomy    . Cesarean section    . Knee  surgery    . Dilation and curettage of uterus N/A 09/04/2012    Procedure: DILATATION AND CURETTAGE, Hysteroscopy;  Surgeon: Tereso NewcomerUgonna A Anyanwu, MD;  Location: [facility];  Service: Gynecology;  Laterality: N/A;   Family History  Problem Relation Age of Onset  . Diabetes Maternal Aunt   . Cancer Maternal Aunt   . Diabetes Maternal Grandmother   . Cancer Paternal Grandmother    History  Substance Use Topics  . Smoking status: Current Every Day Smoker -- 0.50 packs/day    Types: Cigarettes  . Smokeless tobacco: Not on file  . Alcohol Use: No   OB History   Grav Para Term Preterm Abortions TAB SAB Ect Mult Living   3 3 2 1  0 0 0 0 0 2     Review of Systems  Constitutional: Negative for fever and chills.  Respiratory: Negative for cough and shortness of breath.   Cardiovascular: Negative for chest pain.  Gastrointestinal: Negative for abdominal pain.  Musculoskeletal: Negative for back pain.       Right knee pain  All other systems reviewed and are negative.    Allergies  Toradol  Home Medications   No current outpatient prescriptions on file. Triage Vitals: BP 119/84  Pulse 65  Temp(Src) 98.5 F (36.9 C)  Resp 20  SpO2 98% Physical Exam  Nursing note and vitals reviewed. Constitutional: She is oriented to person, place, and time. She appears well-developed and well-nourished. No distress.  HENT:  Head: Normocephalic and atraumatic.  Eyes: Conjunctivae are normal. Right eye exhibits no discharge. Left eye exhibits no discharge.  Neck: Normal range of motion.  Cardiovascular: Normal rate.   Pulmonary/Chest: Effort normal. No respiratory distress.  Musculoskeletal: Normal range of motion. She exhibits tenderness. She exhibits no edema.  Tenderness to palpation to medial and lateral aspect of patella. NV intact.   Neurological: She is alert and oriented to person, place, and time.  Skin: Skin is warm and dry.  Psychiatric: She has a normal mood and affect. Thought content  normal.    ED Course  Procedures (including critical care time) DIAGNOSTIC STUDIES: Oxygen Saturation is 98% on room air, normal by my interpretation.    COORDINATION OF CARE: At 1230 PM Discussed treatment plan with patient which includes right knee X-ray. Patient agrees.   Labs Review Labs Reviewed - No data to display Imaging Review No results found.   EKG Interpretation None      MDM   Final diagnoses:  Knee pain    Pt has chronic knee pain and this is indicative of her typical pain. Pt refused x-ray. Norco given here for pain and a prescription given for a few. Pt reports that she has an appt tomorrow for an "orange card". Advised further follow-up with Ortho.   I personally performed the services described in this documentation, which was scribed in my presence. The recorded information has been reviewed and is accurate.      Irish Elders, NP 03/26/13 2251

## 2013-03-26 NOTE — Progress Notes (Signed)
[facility] pt [facility] application to help patient establish primary care. Patient stated that she was going tomorrow to get [employer] with [facility]. Patient stated that she had a PCP apt in 3 weeks.

## 2013-03-26 NOTE — ED Notes (Signed)
Patient refusing xray due to lack of insurance. Kelly notified.

## 2013-03-27 NOTE — ED Provider Notes (Signed)
Medical screening examination/treatment/procedure(s) were performed by non-physician practitioner and as supervising physician I was immediately available for consultation/collaboration.   EKG Interpretation None        Edwin Baines H Gevork Ayyad, MD 03/27/13 1503 

## 2013-03-28 ENCOUNTER — Ambulatory Visit

## 2013-05-22 ENCOUNTER — Encounter (HOSPITAL_COMMUNITY): Payer: Self-pay | Admitting: Emergency Medicine

## 2013-05-22 ENCOUNTER — Emergency Department (HOSPITAL_COMMUNITY)
Admission: EM | Admit: 2013-05-22 | Discharge: 2013-05-22 | Disposition: A | Discharge status: Home / Self Care | Attending: Emergency Medicine | Admitting: Emergency Medicine

## 2013-05-22 DIAGNOSIS — F17200 Nicotine dependence, unspecified, uncomplicated: Secondary | ICD-10-CM | POA: Insufficient documentation

## 2013-05-22 DIAGNOSIS — Z8739 Personal history of other diseases of the musculoskeletal system and connective tissue: Secondary | ICD-10-CM | POA: Insufficient documentation

## 2013-05-22 DIAGNOSIS — I10 Essential (primary) hypertension: Secondary | ICD-10-CM | POA: Insufficient documentation

## 2013-05-22 DIAGNOSIS — S99929A Unspecified injury of unspecified foot, initial encounter: Principal | ICD-10-CM

## 2013-05-22 DIAGNOSIS — X500XXA Overexertion from strenuous movement or load, initial encounter: Secondary | ICD-10-CM | POA: Insufficient documentation

## 2013-05-22 DIAGNOSIS — Y9389 Activity, other specified: Secondary | ICD-10-CM | POA: Insufficient documentation

## 2013-05-22 DIAGNOSIS — M25561 Pain in right knee: Secondary | ICD-10-CM

## 2013-05-22 DIAGNOSIS — G8929 Other chronic pain: Secondary | ICD-10-CM | POA: Insufficient documentation

## 2013-05-22 DIAGNOSIS — S8990XA Unspecified injury of unspecified lower leg, initial encounter: Secondary | ICD-10-CM | POA: Insufficient documentation

## 2013-05-22 DIAGNOSIS — Y929 Unspecified place or not applicable: Secondary | ICD-10-CM | POA: Insufficient documentation

## 2013-05-22 DIAGNOSIS — S99919A Unspecified injury of unspecified ankle, initial encounter: Principal | ICD-10-CM

## 2013-05-22 MED ORDER — MELOXICAM 7.5 MG PO TABS: 15.0000 mg | ORAL_TABLET | Freq: Every day | ORAL | Status: DC

## 2013-05-22 MED ORDER — OXYCODONE-ACETAMINOPHEN 5-325 MG PO TABS: ORAL_TABLET | ORAL | Status: DC

## 2013-05-22 NOTE — ED Notes (Signed)
Patient states she has chronic knee problems.  Patient states she has no recent injury, but R knee hurts worse today. Patient states has not taken anything for pain at home.

## 2013-05-22 NOTE — ED Provider Notes (Signed)
CSN: [id number]     Arrival date & time 05/22/13  0932 History  This chart was scribed for April FinnerErin O'Malley, PA working with April OctaveStephen Rancour, MD by Quintella ReichertMatthew Underwood, ED Scribe. This patient was seen in room TR11C/TR11C and the patient's care was started at 10:24 AM.   Chief Complaint  Patient presents with  . Knee Pain     The history is provided by the patient. No language interpreter was used.    HPI Comments: April Warner is a 33 y.o. female who presents to the Emergency Department complaining of right knee pain that began last night.  Pt states she has had pain to that knee on-and-off for sometime and has been told her cartilage is "thinning out."  However last night the knee "went out" and her pain has worsened since then.  Pain is severe and radiates to the top of her foot.  She has not attempted to treat pain pta.  She also reports associated cracking and popping to the knee with movement.  She denies recent fall or other injury.  Pt is not currently seen by an orthopedist for her knee because she does not have insurance and cannot afford to.  However she reports she has received steroid injections and Percocet in the past.  She states she is currently on the waiting list for insurance.  [facility] in the past.   Past Medical History  Diagnosis Date  . Hypertension   . Obesity   . Knee pain   . Arthritis     bil knees    Past Surgical History  Procedure Laterality Date  . Cholecystectomy    . Cesarean section    . Knee surgery    . Dilation and curettage of uterus N/A 09/04/2012    Procedure: DILATATION AND CURETTAGE, Hysteroscopy;  Surgeon: Tereso NewcomerUgonna A Anyanwu, MD;  Location: [facility];  Service: Gynecology;  Laterality: N/A;    Family History  Problem Relation Age of Onset  . Diabetes Maternal Aunt   . Cancer Maternal Aunt   . Diabetes Maternal Grandmother   . Cancer Paternal Grandmother     History  Substance Use Topics  . Smoking  status: Current Every Day Smoker -- 0.50 packs/day    Types: Cigarettes  . Smokeless tobacco: Not on file  . Alcohol Use: No    OB History   Grav Para Term Preterm Abortions TAB SAB Ect Mult Living   3 3 2 1  0 0 0 0 0 2       Review of Systems  Musculoskeletal: Positive for arthralgias (right knee).  All other systems reviewed and are negative.     Allergies  Toradol  Home Medications   Prior to Admission medications   Not on File   BP 151/92  Pulse 78  Temp(Src) 98.7 F (37.1 C) (Oral)  Resp 20  Ht 5\' 9"  (1.753 m)  Wt 400 lb (181.439 kg)  BMI 59.04 kg/m2  SpO2 98%  Physical Exam  Nursing note and vitals reviewed. Constitutional: She is oriented to person, place, and time. She appears well-developed and well-nourished.  Morbidly obese female lying on exam bed, NAD.  HENT:  Head: Normocephalic and atraumatic.  Eyes: EOM are normal.  Neck: Normal range of motion.  Cardiovascular: Normal rate.   Pulmonary/Chest: Effort normal.  Musculoskeletal: Normal range of motion. She exhibits tenderness. She exhibits no edema.       Right knee: Tenderness found.  Tenderness over  anterior right knee.  Skin intact.  No ecchymosis or erythema.  Neurological: She is alert and oriented to person, place, and time.  Skin: Skin is warm and dry.  Psychiatric: She has a normal mood and affect. Her behavior is normal.    ED Course  Procedures (including critical care time)  DIAGNOSTIC STUDIES: Oxygen Saturation is 98% on room air, normal by my interpretation.    COORDINATION OF CARE: 10:31 AM-Discussed treatment plan which includes Percocet and meloxicam with pt at bedside and pt agreed to plan.    Labs Review Labs Reviewed - No data to display  Imaging Review No results found.   EKG Interpretation None      MDM   Final diagnoses:  Chronic pain of right knee    Pt presenting with exacerbation of chronic right knee pain. Denies knew falls or trauma. Right  foot-neurovascularly in tact. No evidence of underlying infection. Rx: percocet and mobic. [facility] for ongoing knee pain. Pt verbalized understanding and agreement with tx plan.  I personally performed the services described in this documentation, which was scribed in my presence. The recorded information has been reviewed and is accurate.    April Finnerrin O'Malley, PA-C 05/22/13 1041

## 2013-05-22 NOTE — ED Provider Notes (Signed)
Medical screening examination/treatment/procedure(s) were performed by non-physician practitioner and as supervising physician I was immediately available for consultation/collaboration.   EKG Interpretation None        April OctaveStephen Patrice Moates, MD 05/22/13 [phone]

## 2013-05-22 NOTE — Discharge Instructions (Signed)
Arthralgia  Arthralgia is joint pain. A joint is a place where two bones meet. Joint pain can happen for many reasons. The joint can be bruised, stiff, infected, or weak from aging. Pain usually goes away after resting and taking medicine for soreness.   HOME CARE  · Rest the joint as told by your doctor.  · Keep the sore joint raised (elevated) for the first 24 hours.  · Put ice on the joint area.  · Put ice in a plastic bag.  · Place a towel between your skin and the bag.  · Leave the ice on for 15-20 minutes, 03-04 times a day.  · Wear your splint, casting, elastic bandage, or sling as told by your doctor.  · Only take medicine as told by your doctor. Do not take aspirin.  · Use crutches as told by your doctor. Do not put weight on the joint until told to by your doctor.  GET HELP RIGHT AWAY IF:   · You have bruising, puffiness (swelling), or more pain.  · Your fingers or toes turn blue or start to lose feeling (numb).  · Your medicine does not lessen the pain.  · Your pain becomes severe.  · You have a temperature by mouth above 102° F (38.9° C), not controlled by medicine.  · You cannot move or use the joint.  MAKE SURE YOU:   · Understand these instructions.  · Will watch your condition.  · Will get help right away if you are not doing well or get worse.  Document Released: 12/28/2008 Document Revised: 04/03/2011 Document Reviewed: 12/28/2008  ExitCare® Patient Information ©2014 [employer].

## 2013-07-09 ENCOUNTER — Emergency Department (HOSPITAL_COMMUNITY)

## 2013-07-09 ENCOUNTER — Emergency Department (HOSPITAL_COMMUNITY)
Admission: EM | Admit: 2013-07-09 | Discharge: 2013-07-09 | Disposition: A | Discharge status: Home / Self Care | Attending: Emergency Medicine | Admitting: Emergency Medicine

## 2013-07-09 ENCOUNTER — Encounter (HOSPITAL_COMMUNITY): Payer: Self-pay | Admitting: Emergency Medicine

## 2013-07-09 DIAGNOSIS — Z888 Allergy status to other drugs, medicaments and biological substances status: Secondary | ICD-10-CM | POA: Insufficient documentation

## 2013-07-09 DIAGNOSIS — S93409A Sprain of unspecified ligament of unspecified ankle, initial encounter: Secondary | ICD-10-CM | POA: Insufficient documentation

## 2013-07-09 DIAGNOSIS — X503XXA Overexertion from repetitive movements, initial encounter: Secondary | ICD-10-CM | POA: Insufficient documentation

## 2013-07-09 DIAGNOSIS — Y9301 Activity, walking, marching and hiking: Secondary | ICD-10-CM | POA: Insufficient documentation

## 2013-07-09 DIAGNOSIS — M25569 Pain in unspecified knee: Secondary | ICD-10-CM | POA: Insufficient documentation

## 2013-07-09 DIAGNOSIS — I10 Essential (primary) hypertension: Secondary | ICD-10-CM | POA: Insufficient documentation

## 2013-07-09 DIAGNOSIS — F17200 Nicotine dependence, unspecified, uncomplicated: Secondary | ICD-10-CM | POA: Insufficient documentation

## 2013-07-09 DIAGNOSIS — M129 Arthropathy, unspecified: Secondary | ICD-10-CM | POA: Insufficient documentation

## 2013-07-09 DIAGNOSIS — Y929 Unspecified place or not applicable: Secondary | ICD-10-CM | POA: Insufficient documentation

## 2013-07-09 MED ORDER — OXYCODONE-ACETAMINOPHEN 5-325 MG PO TABS
2.0000 | ORAL_TABLET | Freq: Once | ORAL | Status: AC
  Administered 2013-07-09: 2 via ORAL
  Filled 2013-07-09: qty 2

## 2013-07-09 MED ORDER — NAPROXEN 500 MG PO TABS: 500.0000 mg | ORAL_TABLET | Freq: Two times a day (BID) | ORAL | Status: DC

## 2013-07-09 NOTE — ED Notes (Signed)
Pt found to be snoring once again, pt reminded to call ride

## 2013-07-09 NOTE — ED Notes (Signed)
Pt calling ride, pt can be medicated with percocet when ride arrives.

## 2013-07-09 NOTE — ED Provider Notes (Signed)
CSN: [id number]     Arrival date & time 07/09/13  0244 History   First MD Initiated Contact with Patient 07/09/13 [phone]     Chief Complaint  Patient presents with  . Ankle Injury    right   HPI  History provided by the patient. Patient is a 33 year old female with history of hypertension, morbid obesity, arthritis and ongoing knee pain who presents with complaints of right ankle injury and pain with associated right knee pain. Patient states that she missed a step and twisted her right ankle 4 days ago. Since that time she has had pain when she walks with her feet. She also has had referred pain into the right knee which is worse than her chronic baseline pain. She does use Bayer Aspirin sent home for her arthritis pains but this has not helped significantly. She is also complaining of some swelling and pain to both of her feet. Denies any chest pain or shortness of breath. No prior history of DVTs.  Past Medical History  Diagnosis Date  . Hypertension   . Obesity   . Knee pain   . Arthritis     bil knees   Past Surgical History  Procedure Laterality Date  . Cholecystectomy    . Cesarean section    . Dilation and curettage of uterus N/A 09/04/2012    Procedure: DILATATION AND CURETTAGE, Hysteroscopy;  Surgeon: Tereso NewcomerUgonna A Anyanwu, MD;  Location: [facility];  Service: Gynecology;  Laterality: N/A;   Family History  Problem Relation Age of Onset  . Diabetes Maternal Aunt   . Cancer Maternal Aunt   . Diabetes Maternal Grandmother   . Cancer Paternal Grandmother    History  Substance Use Topics  . Smoking status: Current Every Day Smoker -- 0.50 packs/day    Types: Cigarettes  . Smokeless tobacco: Not on file  . Alcohol Use: No   OB History   Grav Para Term Preterm Abortions TAB SAB Ect Mult Living   3 3 2 1  0 0 0 0 0 2     Review of Systems  All other systems reviewed and are negative.     Allergies  Toradol  Home Medications   Prior to Admission medications   Not on File    BP 160/86  Pulse 85  Temp(Src) 98.5 F (36.9 C) (Oral)  Resp 20  Ht 5\' 8"  (1.727 m)  Wt 322 lb (146.058 kg)  BMI 48.97 kg/m2  SpO2 95%  LMP 06/19/2013 Physical Exam  Nursing note and vitals reviewed. Constitutional: She is oriented to person, place, and time. She appears well-developed and well-nourished. No distress.  HENT:  Head: Normocephalic.  Cardiovascular: Normal rate and regular rhythm.   Pulmonary/Chest: Effort normal and breath sounds normal.  Musculoskeletal:  Patient is limited due to body habitus. Patient does exhibit tenderness around the anterior right ankle. There does not appear to be any asymmetric swelling there is mild swelling to bilateral lower extremities. Patient also having some tenderness to the right knee especially with passive range of motion. She is neurovascularly intact distally bilaterally.  Neurological: She is alert and oriented to person, place, and time.  Skin: Skin is warm and dry. No rash noted.  Psychiatric: She has a normal mood and affect. Her behavior is normal.    ED Course  Procedures   COORDINATION OF CARE:  Nursing notes reviewed. Vital signs reviewed. Initial pt interview and examination performed.   Filed Vitals:   07/09/13 0250  BP: 160/86  Pulse: 85  Temp: 98.5 F (36.9 C)  TempSrc: Oral  Resp: 20  Height: 5\' 8"  (1.727 m)  Weight: 322 lb (146.058 kg)  SpO2: 95%    5:00AM-patient seen and evaluated. Patient morbidly obese. Was seen and evaluated for on going knee pains recently. Reports also having a right ankle injury. States she thinks she has a sprain. She has been ambulatory but with a limp.  X-rays reviewed. No signs of fracture or other concerning injury. Some arthritis in the knee present. Will refer patient to orthopedics for further evaluation and treatment.   Treatment plan initiated: Medications  oxyCODONE-acetaminophen (PERCOCET/ROXICET) 5-325 MG per tablet 2 tablet (not administered)        Imaging Review Dg Ankle Complete Right  07/09/2013   CLINICAL DATA:  Slip and twisted ankle.  EXAM: RIGHT ANKLE - COMPLETE 3+ VIEW  COMPARISON:  None.  FINDINGS: No fracture deformity nor dislocation. The ankle mortise appears congruent and the tibiofibular syndesmosis intact. Os trigonum. No destructive bony lesions. Diffuse soft tissue swelling without subcutaneous gas or radiopaque foreign bodies.  IMPRESSION: Soft tissue swelling without fracture deformity or dislocation.   Electronically Signed   By: Awilda Metroourtnay  Bloomer   On: 07/09/2013 05:34   Dg Knee Complete 4 Views Right  07/09/2013   CLINICAL DATA:  Twisting injury to right knee. Worsening right knee pain.  EXAM: RIGHT KNEE - COMPLETE 4+ VIEW  COMPARISON:  Right knee radiographs performed 02/07/2011  FINDINGS: There is no evidence of fracture or dislocation. The joint spaces are preserved. Mild marginal osteophytes are noted arising at all three compartments.  No significant joint effusion is seen. The visualized soft tissues are normal in appearance.  IMPRESSION: 1. No evidence of fracture or dislocation. 2. 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   Electronically Signed   By: Roanna RaiderJeffery  Chang M.D.   On: 07/09/2013 05:33     MDM   Final diagnoses:  Ankle sprain      Angus Sellereter S Dammen, PA-C 07/09/13 [phone]

## 2013-07-09 NOTE — ED Notes (Signed)
Patient states she mis stepped on a roll of tissue 4 days ago and rolled her R ankle. Patient c/o pain radiating into her R knee that she has chronic pain in. Patient reports taking Bayer ASA at home on Monday without relief.

## 2013-07-09 NOTE — Discharge Instructions (Signed)
Your x-rays did not show any broken bones other concerning injuries. Use rest, ice, compression and elevation to reduce pain and swelling. Followup with orthopedic specialist for continued evaluation and treatment.     Ankle Sprain An ankle sprain is an injury to the strong, fibrous tissues (ligaments) that hold the bones of your ankle joint together.  CAUSES An ankle sprain is usually caused by a fall or by twisting your ankle. Ankle sprains most commonly occur when you step on the outer edge of your foot, and your ankle turns inward. People who participate in sports are more prone to these types of injuries.  SYMPTOMS   Pain in your ankle. The pain may be present at rest or only when you are trying to stand or walk.  Swelling.  Bruising. Bruising may develop immediately or within 1 to 2 days after your injury.  Difficulty standing or walking, particularly when turning corners or changing directions. DIAGNOSIS  Your caregiver will ask you details about your injury and perform a physical exam of your ankle to determine if you have an ankle sprain. During the physical exam, your caregiver will press on and apply pressure to specific areas of your foot and ankle. Your caregiver will try to move your ankle in certain ways. An X-ray exam may be done to be sure a bone was not broken or a ligament did not separate from one of the bones in your ankle (avulsion fracture).  TREATMENT  Certain types of braces can help stabilize your ankle. Your caregiver can make a recommendation for this. Your caregiver may recommend the use of medicine for pain. If your sprain is severe, your caregiver may refer you to a surgeon who helps to restore function to parts of your skeletal system (orthopedist) or a physical therapist. HOME CARE INSTRUCTIONS   Apply ice to your injury for 1-2 days or as directed by your caregiver. Applying ice helps to reduce inflammation and pain.  Put ice in a plastic bag.  Place a  towel between your skin and the bag.  Leave the ice on for 15-20 minutes at a time, every 2 hours while you are awake.  Only take over-the-counter or prescription medicines for pain, discomfort, or fever as directed by your caregiver.  Elevate your injured ankle above the level of your heart as much as possible for 2-3 days.  If your caregiver recommends crutches, use them as instructed. Gradually put weight on the affected ankle. Continue to use crutches or a cane until you can walk without feeling pain in your ankle.  If you have a plaster splint, wear the splint as directed by your caregiver. Do not rest it on anything harder than a pillow for the first 24 hours. Do not put weight on it. Do not get it wet. You may take it off to take a shower or bath.  You may have been given an elastic bandage to wear around your ankle to provide support. If the elastic bandage is too tight (you have numbness or tingling in your foot or your foot becomes cold and blue), adjust the bandage to make it comfortable.  If you have an air splint, you may blow more air into it or let air out to make it more comfortable. You may take your splint off at night and before taking a shower or bath. Wiggle your toes in the splint several times per day to decrease swelling. SEEK MEDICAL CARE IF:   You have rapidly increasing bruising  or swelling.  Your toes feel extremely cold or you lose feeling in your foot.  Your pain is not relieved with medicine. SEEK IMMEDIATE MEDICAL CARE IF:  Your toes are numb or blue.  You have severe pain that is increasing. MAKE SURE YOU:   Understand these instructions.  Will watch your condition.  Will get help right away if you are not doing well or get worse. Document Released: 01/09/2005 Document Revised: 10/04/2011 Document Reviewed: 01/21/2011 [facility] Patient Information 2015 [place]. This information is not intended to replace advice given to you by your health  care provider. Make sure you discuss any questions you have with your health care provider.

## 2013-07-09 NOTE — ED Notes (Signed)
Pt again found snoring on stretcher, pt ask to sit up and attempt to call ride. Pt began complaining again of pain and lack of etiology for pain. Discussed with patient obesity can be distressing on joints and need for ortho and PCP care to manage pain. Pt states she will just continue coming back to ED. R ankle wrapped with ace wrap her her request. Pt will continue to call for ride. Pt aware she can receive narcotics once her ride arrives.

## 2013-07-11 NOTE — ED Provider Notes (Signed)
Medical screening examination/treatment/procedure(s) were performed by non-physician practitioner and as supervising physician I was immediately available for consultation/collaboration.   EKG Interpretation None        David Yelverton, MD 07/11/13 2342 

## 2013-08-24 ENCOUNTER — Encounter (HOSPITAL_COMMUNITY): Payer: Self-pay | Admitting: *Deleted

## 2013-08-24 ENCOUNTER — Inpatient Hospital Stay (HOSPITAL_COMMUNITY)

## 2013-08-24 ENCOUNTER — Inpatient Hospital Stay (HOSPITAL_COMMUNITY)
Admission: AD | Admit: 2013-08-24 | Discharge: 2013-08-24 | Disposition: A | Discharge status: Home / Self Care | Source: Ambulatory Visit | Attending: Obstetrics and Gynecology | Admitting: Obstetrics and Gynecology

## 2013-08-24 DIAGNOSIS — F17200 Nicotine dependence, unspecified, uncomplicated: Secondary | ICD-10-CM | POA: Insufficient documentation

## 2013-08-24 DIAGNOSIS — N949 Unspecified condition associated with female genital organs and menstrual cycle: Secondary | ICD-10-CM | POA: Insufficient documentation

## 2013-08-24 DIAGNOSIS — E669 Obesity, unspecified: Secondary | ICD-10-CM | POA: Insufficient documentation

## 2013-08-24 DIAGNOSIS — D649 Anemia, unspecified: Secondary | ICD-10-CM | POA: Insufficient documentation

## 2013-08-24 DIAGNOSIS — D500 Iron deficiency anemia secondary to blood loss (chronic): Secondary | ICD-10-CM

## 2013-08-24 DIAGNOSIS — N938 Other specified abnormal uterine and vaginal bleeding: Secondary | ICD-10-CM | POA: Insufficient documentation

## 2013-08-24 DIAGNOSIS — R109 Unspecified abdominal pain: Secondary | ICD-10-CM | POA: Insufficient documentation

## 2013-08-24 DIAGNOSIS — I10 Essential (primary) hypertension: Secondary | ICD-10-CM | POA: Insufficient documentation

## 2013-08-24 LAB — CBC WITH DIFFERENTIAL/PLATELET
BASOS PCT: 0 % (ref 0–1)
Basophils Absolute: 0 10*3/uL (ref 0.0–0.1)
EOS ABS: 0.1 10*3/uL (ref 0.0–0.7)
Eosinophils Relative: 1 % (ref 0–5)
HEMATOCRIT: 30.5 % — AB (ref 36.0–46.0)
HEMOGLOBIN: 9.8 g/dL — AB (ref 12.0–15.0)
Lymphocytes Relative: 35 % (ref 12–46)
Lymphs Abs: 3 10*3/uL (ref 0.7–4.0)
MCH: 22.4 pg — AB (ref 26.0–34.0)
MCHC: 32.1 g/dL (ref 30.0–36.0)
MCV: 69.8 fL — ABNORMAL LOW (ref 78.0–100.0)
MONO ABS: 0.5 10*3/uL (ref 0.1–1.0)
MONOS PCT: 6 % (ref 3–12)
Neutro Abs: 4.9 10*3/uL (ref 1.7–7.7)
Neutrophils Relative %: 57 % (ref 43–77)
Platelets: 181 10*3/uL (ref 150–400)
RBC: 4.37 MIL/uL (ref 3.87–5.11)
RDW: 16.7 % — ABNORMAL HIGH (ref 11.5–15.5)
WBC: 8.5 10*3/uL (ref 4.0–10.5)

## 2013-08-24 LAB — URINALYSIS, ROUTINE W REFLEX MICROSCOPIC
BILIRUBIN URINE: NEGATIVE
Glucose, UA: NEGATIVE mg/dL
Ketones, ur: NEGATIVE mg/dL
Nitrite: POSITIVE — AB
PH: 6 (ref 5.0–8.0)
Protein, ur: NEGATIVE mg/dL
SPECIFIC GRAVITY, URINE: 1.01 (ref 1.005–1.030)
Urobilinogen, UA: 0.2 mg/dL (ref 0.0–1.0)

## 2013-08-24 LAB — URINE MICROSCOPIC-ADD ON

## 2013-08-24 LAB — POCT PREGNANCY, URINE: PREG TEST UR: NEGATIVE

## 2013-08-24 LAB — TYPE AND SCREEN
ABO/RH(D): O POS
Antibody Screen: NEGATIVE

## 2013-08-24 MED ORDER — OXYCODONE-ACETAMINOPHEN 5-325 MG PO TABS
1.0000 | ORAL_TABLET | ORAL | Status: DC | PRN
Start: 2013-08-24 — End: 2013-09-30

## 2013-08-24 MED ORDER — IBUPROFEN 600 MG PO TABS: 600.0000 mg | ORAL_TABLET | Freq: Four times a day (QID) | ORAL | Status: DC | PRN

## 2013-08-24 MED ORDER — OXYCODONE-ACETAMINOPHEN 5-325 MG PO TABS
2.0000 | ORAL_TABLET | Freq: Once | ORAL | Status: AC
  Administered 2013-08-24: 2 via ORAL
  Filled 2013-08-24: qty 2

## 2013-08-24 MED ORDER — MEGESTROL ACETATE 40 MG PO TABS: ORAL_TABLET | ORAL | Status: DC

## 2013-08-24 MED ORDER — HYDROMORPHONE HCL PF 1 MG/ML IJ SOLN
1.0000 mg | Freq: Once | INTRAMUSCULAR | Status: DC
  Filled 2013-08-24: qty 1

## 2013-08-24 NOTE — MAU Note (Signed)
Had heavy vaginal bleeding back in sept, had surgery to stop the bleeding.  Needed blood transfusions.  Has been bleeding for 3 weeks (heavy, clots) will go away a for a couple hours then comes right back.  Having contraction like pain in belly.

## 2013-08-24 NOTE — Discharge Instructions (Signed)
Get an over the counter iron supplement to take every day by the package directions.  You may also need a stool softener.

## 2013-08-24 NOTE — MAU Provider Note (Signed)
History     CSN: [id number]  Arrival date and time: 08/24/13 4696   First Provider Initiated Contact with Patient 08/24/13 [phone]      Chief Complaint  Patient presents with  . Vaginal Bleeding   HPI April Warner is a 33 y.o. [id number] who presents to MAU today with complaint of heavy vaginal bleeding. The patient has a history of DUB and has required blood transfusions. On 09/04/12 she had a hysteroscopy, D&C performed by Dr. Macon Large. She states she has only had 2 episodes of bleeding since then both lasting only 1-2 days. She states that this episode of bleeding started ~ 3 weeks ago. She has used packs of pads most days. She bled through her clothes on her way here today. She endorses associated feelings of lightheadedness and weakness. She states lower abdominal pain that has become progressively worse since onset. She took 800 mg Ibuprofen this morning without relief.   OB History   Grav Para Term Preterm Abortions TAB SAB Ect Mult Living   3 3 2 1  0 0 0 0 0 2      Past Medical History  Diagnosis Date  . Hypertension   . Obesity   . Knee pain   . Arthritis     bil knees    Past Surgical History  Procedure Laterality Date  . Cholecystectomy    . Cesarean section    . Dilation and curettage of uterus N/A 09/04/2012    Procedure: DILATATION AND CURETTAGE, Hysteroscopy;  Surgeon: Tereso Newcomer, MD;  Location: [facility];  Service: Gynecology;  Laterality: N/A;    Family History  Problem Relation Age of Onset  . Diabetes Maternal Aunt   . Cancer Maternal Aunt   . Diabetes Maternal Grandmother   . Cancer Paternal Grandmother     History  Substance Use Topics  . Smoking status: Current Every Day Smoker -- 0.50 packs/day    Types: Cigarettes  . Smokeless tobacco: Not on file  . Alcohol Use: No    Allergies:  Allergies  Allergen Reactions  . Toradol [Ketorolac Tromethamine] Nausea And Vomiting and Other (See Comments)    diarrhea    Prescriptions prior to  admission  Medication Sig Dispense Refill  . ibuprofen (ADVIL,MOTRIN) 200 MG tablet Take 200 mg by mouth every 6 (six) hours as needed for moderate pain.        Review of Systems  Constitutional: Positive for malaise/fatigue. Negative for fever.  Gastrointestinal: Positive for abdominal pain.  Genitourinary:       + vaginal bleeding  Neurological: Positive for dizziness and weakness. Negative for loss of consciousness.   Physical Exam   Blood pressure 105/51, pulse 75, temperature 97.1 F (36.2 C), resp. rate 18, height 5\' 9"  (1.753 m), weight 412 lb (186.882 kg).  Physical Exam  Constitutional: She is oriented to person, place, and time. She appears well-developed and well-nourished. No distress.  HENT:  Head: Normocephalic.  Cardiovascular: Normal rate.   Respiratory: Effort normal.  GI: Soft. She exhibits no distension and no mass. There is tenderness (mild tenderness to palpation of the lower abdomen). There is no rebound and no guarding.  Neurological: She is alert and oriented to person, place, and time.  Skin: Skin is warm and dry. No erythema.  Psychiatric: She has a normal mood and affect.   Results for orders placed during the hospital encounter of 08/24/13 (from the past 24 hour(s))  URINALYSIS, ROUTINE W REFLEX MICROSCOPIC  Status: Abnormal   Collection Time    08/24/13  5:55 AM      Result Value Ref Range   Color, Urine YELLOW  YELLOW   APPearance CLEAR  CLEAR   Specific Gravity, Urine 1.010  1.005 - 1.030   pH 6.0  5.0 - 8.0   Glucose, UA NEGATIVE  NEGATIVE mg/dL   Hgb urine dipstick LARGE (*) NEGATIVE   Bilirubin Urine NEGATIVE  NEGATIVE   Ketones, ur NEGATIVE  NEGATIVE mg/dL   Protein, ur NEGATIVE  NEGATIVE mg/dL   Urobilinogen, UA 0.2  0.0 - 1.0 mg/dL   Nitrite POSITIVE (*) NEGATIVE   Leukocytes, UA SMALL (*) NEGATIVE  URINE MICROSCOPIC-ADD ON     Status: Abnormal   Collection Time    08/24/13  5:55 AM      Result Value Ref Range   Squamous  Epithelial / LPF FEW (*) RARE   WBC, UA 7-10  <3 WBC/hpf   RBC / HPF TOO NUMEROUS TO COUNT  <3 RBC/hpf   Bacteria, UA MANY (*) RARE   Urine-Other MUCOUS PRESENT    POCT PREGNANCY, URINE     Status: None   Collection Time    08/24/13  6:17 AM      Result Value Ref Range   Preg Test, Ur NEGATIVE  NEGATIVE  CBC WITH DIFFERENTIAL     Status: Abnormal   Collection Time    08/24/13  6:34 AM      Result Value Ref Range   WBC 8.5  4.0 - 10.5 K/uL   RBC 4.37  3.87 - 5.11 MIL/uL   Hemoglobin 9.8 (*) 12.0 - 15.0 g/dL   HCT 16.1 (*) 09.6 - 04.5 %   MCV 69.8 (*) 78.0 - 100.0 fL   MCH 22.4 (*) 26.0 - 34.0 pg   MCHC 32.1  30.0 - 36.0 g/dL   RDW 40.9 (*) 81.1 - 91.4 %   Platelets 181  150 - 400 K/uL   Neutrophils Relative % 57  43 - 77 %   Neutro Abs 4.9  1.7 - 7.7 K/uL   Lymphocytes Relative 35  12 - 46 %   Lymphs Abs 3.0  0.7 - 4.0 K/uL   Monocytes Relative 6  3 - 12 %   Monocytes Absolute 0.5  0.1 - 1.0 K/uL   Eosinophils Relative 1  0 - 5 %   Eosinophils Absolute 0.1  0.0 - 0.7 K/uL   Basophils Relative 0  0 - 1 %   Basophils Absolute 0.0  0.0 - 0.1 K/uL    MAU Course  Procedures None  MDM UA, CBC and type and screen Patient has UTI.  Hgb is stable VSS Pelvic US ordered Patient given 1 mg Dilaudid IM for pain while in MAU. Patient refused injection. Order changed to 2 Percocet PO.   0800 - Patient in [place]. Care turned over to Nolene Bernheim, NP  CLINICAL DATA: Bruit history of heavy bleeding with clots.  EXAM:  TRANSABDOMINAL ULTRASOUND OF PELVIS  TECHNIQUE:  Transabdominal ultrasound examination of the pelvis was performed  including evaluation of the uterus, ovaries, adnexal regions, and  pelvic cul-de-sac.  COMPARISON: Prior pelvic ultrasound 08/12/2012  FINDINGS:  Uterus  Measurements: 12.8 x 5.3 x 6.4 cm. Difficult image secondary to  patient's body habitus. C-section scar present anteriorly. No focal  lesion or abnormality.  Endometrium  Thickness: 5.4 mm. No  focal abnormality visualized.  Right ovary  Measurements: 2.3 x 3.9 x 1.9 cm. Normal  appearance/no adnexal mass.  Left ovary  Measurements: 2.3 x 2.4 x 3.1 cm. Normal appearance/no adnexal mass.  Other findings: No free fluid  IMPRESSION:  Essentially unremarkable pelvic ultrasound.   Has obtained pain relief with the Percocet.  Patient is sleeping.  Freddi StarrJulie N Ethier, PA-C  08/24/2013, 7:08 AM  Assessment and Plan  Abnormal vaginal bleeding. Abdominal pain Anemia  Plan Urine culture pending. Rx Ibuprofen 600 mg q 6h with food for pain. Rx Megace 40 MG one by mouth 3 times a day until bleeding stops and then taper to one pill a day until you have been seen in the clinic. Rx Percocet one PO q 4-6 hour as needed for severe pain. #12 Message sent to clinic to schedule follow up appointment. Get an over the counter iron supplement and a stool softener and take daily until you have been seen in the clinic.

## 2013-08-26 ENCOUNTER — Other Ambulatory Visit: Payer: Self-pay | Admitting: Obstetrics and Gynecology

## 2013-08-26 ENCOUNTER — Telehealth: Payer: Self-pay | Admitting: Obstetrics and Gynecology

## 2013-08-26 LAB — URINE CULTURE: Colony Count: >=100000

## 2013-08-26 NOTE — MAU Provider Note (Signed)
Attestation of Attending Supervision of Advanced Practitioner: Evaluation and management procedures were performed by the PA/NP/CNM/OB Fellow under my supervision/collaboration. Chart reviewed and agree with management and plan.  Anniebelle Devore V 08/26/2013 7:16 AM

## 2013-08-26 NOTE — Telephone Encounter (Signed)
Urine culture + 10 x5th Ecoli.  Pan-sensitive. Unable to contact pt as phone not in order.  Plan:will ask Lilyan Punt Burleson, NP to followup  With antibiotic , as escribe not available.

## 2013-08-27 ENCOUNTER — Encounter: Payer: Self-pay | Admitting: Nurse Practitioner

## 2013-08-27 ENCOUNTER — Telehealth: Payer: Self-pay | Admitting: Nurse Practitioner

## 2013-08-27 DIAGNOSIS — N390 Urinary tract infection, site not specified: Secondary | ICD-10-CM | POA: Insufficient documentation

## 2013-08-27 MED ORDER — CIPROFLOXACIN HCL 500 MG PO TABS: 500.0000 mg | ORAL_TABLET | Freq: Two times a day (BID) | ORAL | Status: DC

## 2013-08-27 NOTE — Telephone Encounter (Signed)
Urine culture shows UTI and no antibiotics were given at visit. Called into [facility] Cipro 500 mg po BID x 5 days. Pt called and advised to pick up today, increase fluids and follow up as needed. L Kaveri Perras, NP

## 2013-09-25 ENCOUNTER — Encounter: Admitting: Nurse Practitioner

## 2013-09-25 ENCOUNTER — Telehealth: Payer: Self-pay | Admitting: *Deleted

## 2013-09-25 NOTE — Telephone Encounter (Signed)
Called pt in regards to her missed appointment. She stated that she would like to reschedule this appointment. Advised patient that I would send a message to front desk to get her appointment rescheduled.

## 2013-09-30 ENCOUNTER — Emergency Department (HOSPITAL_COMMUNITY)
Admission: EM | Admit: 2013-09-30 | Discharge: 2013-09-30 | Disposition: A | Discharge status: Home / Self Care | Payer: Self-pay | Attending: Emergency Medicine | Admitting: Emergency Medicine

## 2013-09-30 ENCOUNTER — Encounter (HOSPITAL_COMMUNITY): Payer: Self-pay | Admitting: Emergency Medicine

## 2013-09-30 DIAGNOSIS — Z792 Long term (current) use of antibiotics: Secondary | ICD-10-CM | POA: Insufficient documentation

## 2013-09-30 DIAGNOSIS — M1710 Unilateral primary osteoarthritis, unspecified knee: Secondary | ICD-10-CM | POA: Insufficient documentation

## 2013-09-30 DIAGNOSIS — IMO0002 Reserved for concepts with insufficient information to code with codable children: Secondary | ICD-10-CM

## 2013-09-30 DIAGNOSIS — F17200 Nicotine dependence, unspecified, uncomplicated: Secondary | ICD-10-CM | POA: Insufficient documentation

## 2013-09-30 DIAGNOSIS — I10 Essential (primary) hypertension: Secondary | ICD-10-CM | POA: Insufficient documentation

## 2013-09-30 DIAGNOSIS — H9209 Otalgia, unspecified ear: Secondary | ICD-10-CM | POA: Insufficient documentation

## 2013-09-30 DIAGNOSIS — J029 Acute pharyngitis, unspecified: Secondary | ICD-10-CM | POA: Insufficient documentation

## 2013-09-30 DIAGNOSIS — J020 Streptococcal pharyngitis: Secondary | ICD-10-CM | POA: Insufficient documentation

## 2013-09-30 DIAGNOSIS — E669 Obesity, unspecified: Secondary | ICD-10-CM | POA: Insufficient documentation

## 2013-09-30 LAB — RAPID STREP SCREEN (MED CTR MEBANE ONLY): STREPTOCOCCUS, GROUP A SCREEN (DIRECT): NEGATIVE

## 2013-09-30 MED ORDER — HYDROCODONE-ACETAMINOPHEN 7.5-325 MG/15ML PO SOLN: 15.0000 mL | ORAL | Status: DC | PRN

## 2013-09-30 MED ORDER — PENICILLIN G BENZATHINE 1200000 UNIT/2ML IM SUSP
1.2000 10*6.[IU] | Freq: Once | INTRAMUSCULAR | Status: AC
  Administered 2013-09-30: 1.2 10*6.[IU] via INTRAMUSCULAR
  Filled 2013-09-30: qty 2

## 2013-09-30 MED ORDER — DEXAMETHASONE SODIUM PHOSPHATE 10 MG/ML IJ SOLN
10.0000 mg | Freq: Once | INTRAMUSCULAR | Status: AC
  Administered 2013-09-30: 10 mg via INTRAMUSCULAR
  Filled 2013-09-30: qty 1

## 2013-09-30 NOTE — Discharge Instructions (Signed)
Call for a follow up appointment with a Family or Primary Care Provider.  Return if Symptoms worsen.   Take medication as prescribed.  Do not eat or drink before or after anyone for 24 hours.  Change your toothbrush.  You are still contagious 24 hours after you received your antibiotic shot. Salt water gargles 3-4 times a day.

## 2013-09-30 NOTE — ED Provider Notes (Signed)
CSN: [id number]     Arrival date & time 09/30/13  1233 History  This chart was scribed for April Drown, PA-C working with Arby Barrette, MD by Evon Slack, ED Scribe. This patient was seen in room TR05C/TR05C and the patient's care was started at 4:05 PM.      Chief Complaint  Patient presents with  . Sore Throat   HPI Comments: April Warner is a 33 y.o. female who presents to the Emergency Department complaining of progressively worsening sore throat onset 3 days prior. Pt states she is having associated fever, chills, productive cough and ear pain. She states she is having trouble swallowing due to the pain. She states she took some tylenol 1 day ago that provided no relief. Denies any recent recent sick contacts. Denies congestion or rhinorrhea.   The history is provided by the patient. No language interpreter was used.    Past Medical History  Diagnosis Date  . Hypertension   . Obesity   . Knee pain   . Arthritis     bil knees   Past Surgical History  Procedure Laterality Date  . Cholecystectomy    . Cesarean section    . Dilation and curettage of uterus N/A 09/04/2012    Procedure: DILATATION AND CURETTAGE, Hysteroscopy;  Surgeon: Tereso Newcomer, MD;  Location: [facility];  Service: Gynecology;  Laterality: N/A;   Family History  Problem Relation Age of Onset  . Diabetes Maternal Aunt   . Cancer Maternal Aunt   . Diabetes Maternal Grandmother   . Cancer Paternal Grandmother    History  Substance Use Topics  . Smoking status: Current Every Day Smoker -- 0.50 packs/day    Types: Cigarettes  . Smokeless tobacco: Not on file  . Alcohol Use: No   OB History   Grav Para Term Preterm Abortions TAB SAB Ect Mult Living   0 0 0 0 0 2     Review of Systems  Constitutional: Positive for fever and chills.  HENT: Positive for ear pain and sore throat. Negative for congestion and rhinorrhea.   Respiratory: Positive for cough.     Allergies  Toradol  Home  Medications   Prior to Admission medications   Medication Sig Start Date End Date Taking? Authorizing Provider  ciprofloxacin (CIPRO) 500 MG tablet Take 1 tablet (500 mg total) by mouth 2 (two) times daily. 08/27/13   Delbert Phenix, NP  ibuprofen (ADVIL,MOTRIN) 600 MG tablet Take 1 tablet (600 mg total) by mouth every 6 (six) hours as needed. Take with food 08/24/13   Currie Paris, NP  megestrol (MEGACE) 40 MG tablet Take on tablet 3 times a day until the bleeding stops then taper down until once a day. 08/24/13   Currie Paris, NP  oxyCODONE-acetaminophen (PERCOCET/ROXICET) 5-325 MG per tablet Take 1 tablet by mouth every 4 (four) hours as needed for moderate pain or severe pain. 08/24/13   Currie Paris, NP   Triage Vitals: BP 146/81  Pulse 108  Temp(Src) 99.4 F (37.4 C) (Oral)  Resp 22  Ht  (1.727 m)  SpO2 98%  LMP 09/30/2013  Physical Exam  Nursing note and vitals reviewed. Constitutional: She is oriented to person, place, and time. She appears well-developed and well-nourished.  Non-toxic appearance. She does not have a sickly appearance. She does not appear ill. No distress.  HENT:  Head: Normocephalic and atraumatic.  Right Ear: Tympanic membrane normal. Tympanic membrane is  not erythematous and not retracted.  Left Ear: Tympanic membrane normal. Tympanic membrane is not erythematous and not retracted.  Mouth/Throat: Uvula is midline and mucous membranes are normal. No trismus in the jaw. Oropharyngeal exudate, posterior oropharyngeal edema and posterior oropharyngeal erythema present. No tonsillar abscesses.  Uvula midline, arches equal bilaterally. Left tonsil 2+ right tonsil 1+. handling secretions.  Eyes: Conjunctivae and EOM are normal.  Neck: Neck supple. No tracheal deviation present.  Pulmonary/Chest: Effort normal. No respiratory distress.  Musculoskeletal: Normal range of motion.  Lymphadenopathy:       Head (right side): Tonsillar adenopathy present. No  submental and no submandibular adenopathy present.       Head (left side): Tonsillar adenopathy present. No submental and no submandibular adenopathy present.  Neurological: She is alert and oriented to person, place, and time.  Skin: Skin is warm and dry. She is not diaphoretic.  Psychiatric: She has a normal mood and affect. Her behavior is normal.    ED Course  Procedures (including critical care time) DIAGNOSTIC STUDIES: Oxygen Saturation is 98% on RA, normal by my interpretation.    COORDINATION OF CARE:    Labs Review Labs Reviewed  RAPID STREP SCREEN  CULTURE, GROUP A STREP    Imaging Review No results found.   EKG Interpretation None      MDM   Final diagnoses:  Strep pharyngitis   Pt presents with left tonsillar exudate and swelling, no sign of peritonsillar abscess.  Negative rapid strep but will treat with antibiotics after evaluation.  Discussed lab results, and treatment plan with the patient. Return precautions given. Reports understanding and no other concerns at this time.  Patient is stable for discharge at this time. Meds given in ED:  Medications  dexamethasone (DECADRON) injection 10 mg (10 mg Intramuscular Given 09/30/13 1637)  penicillin g benzathine (BICILLIN LA) 1200000 UNIT/2ML injection 1.2 Million Units (1.2 Million Units Intramuscular Given 09/30/13 1637)    Discharge Medication List as of 09/30/2013  4:46 PM    START taking these medications   Details  HYDROcodone-acetaminophen (HYCET) 7.5-325 mg/15 ml solution Take 15 mLs by mouth every 4 (four) hours as needed for moderate pain or severe pain., Starting 09/30/2013, Until Discontinued, Print       I personally performed the services described in this documentation, which was scribed in my presence. The recorded information has been reviewed and is accurate.       April Drown, PA-C 09/30/13 2139

## 2013-09-30 NOTE — ED Notes (Signed)
Pt reports progressively worsening sore throat x 3 days. Reports now pain with swallowing. Pt noted to have white coating to left side of throat, pt reports 10/10 pain to left side of throat. Pt reports fever/chills. Pt speaks in complete sentences, NAD.

## 2013-10-02 ENCOUNTER — Emergency Department (HOSPITAL_COMMUNITY): Payer: Self-pay

## 2013-10-02 ENCOUNTER — Emergency Department (HOSPITAL_COMMUNITY)
Admission: EM | Admit: 2013-10-02 | Discharge: 2013-10-02 | Disposition: A | Discharge status: Home / Self Care | Payer: Self-pay | Attending: Emergency Medicine | Admitting: Emergency Medicine

## 2013-10-02 ENCOUNTER — Encounter (HOSPITAL_COMMUNITY): Payer: Self-pay | Admitting: Emergency Medicine

## 2013-10-02 DIAGNOSIS — E669 Obesity, unspecified: Secondary | ICD-10-CM | POA: Insufficient documentation

## 2013-10-02 DIAGNOSIS — F17200 Nicotine dependence, unspecified, uncomplicated: Secondary | ICD-10-CM | POA: Insufficient documentation

## 2013-10-02 DIAGNOSIS — J029 Acute pharyngitis, unspecified: Secondary | ICD-10-CM | POA: Insufficient documentation

## 2013-10-02 DIAGNOSIS — I10 Essential (primary) hypertension: Secondary | ICD-10-CM | POA: Insufficient documentation

## 2013-10-02 DIAGNOSIS — J039 Acute tonsillitis, unspecified: Secondary | ICD-10-CM | POA: Insufficient documentation

## 2013-10-02 DIAGNOSIS — M129 Arthropathy, unspecified: Secondary | ICD-10-CM | POA: Insufficient documentation

## 2013-10-02 LAB — COMPREHENSIVE METABOLIC PANEL
ALT: 15 U/L (ref 0–35)
ANION GAP: 14 (ref 5–15)
AST: 19 U/L (ref 0–37)
Albumin: 3.1 g/dL — ABNORMAL LOW (ref 3.5–5.2)
Alkaline Phosphatase: 67 U/L (ref 39–117)
BUN: 10 mg/dL (ref 6–23)
CO2: 26 meq/L (ref 19–32)
CREATININE: 0.74 mg/dL (ref 0.50–1.10)
Calcium: 9 mg/dL (ref 8.4–10.5)
Chloride: 101 mEq/L (ref 96–112)
GFR calc Af Amer: >90 mL/min (ref >90)
GLUCOSE: 121 mg/dL — AB (ref 70–99)
Potassium: 3.2 mEq/L — ABNORMAL LOW (ref 3.7–5.3)
SODIUM: 141 meq/L (ref 137–147)
TOTAL PROTEIN: 7.4 g/dL (ref 6.0–8.3)
Total Bilirubin: <0.2 mg/dL — ABNORMAL LOW (ref 0.3–1.2)

## 2013-10-02 LAB — CBC WITH DIFFERENTIAL/PLATELET
Basophils Absolute: 0 10*3/uL (ref 0.0–0.1)
Basophils Relative: 0 % (ref 0–1)
EOS ABS: 0 10*3/uL (ref 0.0–0.7)
EOS PCT: 0 % (ref 0–5)
HEMATOCRIT: 29.7 % — AB (ref 36.0–46.0)
Hemoglobin: 9.7 g/dL — ABNORMAL LOW (ref 12.0–15.0)
LYMPHS ABS: 2.1 10*3/uL (ref 0.7–4.0)
LYMPHS PCT: 20 % (ref 12–46)
MCH: 21.9 pg — AB (ref 26.0–34.0)
MCHC: 32.7 g/dL (ref 30.0–36.0)
MCV: 67 fL — ABNORMAL LOW (ref 78.0–100.0)
MONO ABS: 0.7 10*3/uL (ref 0.1–1.0)
Monocytes Relative: 7 % (ref 3–12)
Neutro Abs: 7.4 10*3/uL (ref 1.7–7.7)
Neutrophils Relative %: 72 % (ref 43–77)
Platelets: 190 10*3/uL (ref 150–400)
RBC: 4.43 MIL/uL (ref 3.87–5.11)
RDW: 17 % — ABNORMAL HIGH (ref 11.5–15.5)
WBC: 10.2 10*3/uL (ref 4.0–10.5)

## 2013-10-02 LAB — TROPONIN I

## 2013-10-02 LAB — CULTURE, GROUP A STREP

## 2013-10-02 LAB — I-STAT CG4 LACTIC ACID, ED: Lactic Acid, Venous: 1.32 mmol/L (ref 0.5–2.2)

## 2013-10-02 LAB — MONONUCLEOSIS SCREEN: MONO SCREEN: NEGATIVE

## 2013-10-02 MED ORDER — OXYCODONE-ACETAMINOPHEN 5-325 MG PO TABS: 1.0000 | ORAL_TABLET | ORAL | Status: DC | PRN

## 2013-10-02 MED ORDER — ONDANSETRON HCL 4 MG/2ML IJ SOLN
4.0000 mg | Freq: Once | INTRAMUSCULAR | Status: AC
  Administered 2013-10-02: 4 mg via INTRAVENOUS
  Filled 2013-10-02: qty 2

## 2013-10-02 MED ORDER — PREDNISONE 20 MG PO TABS: 60.0000 mg | ORAL_TABLET | Freq: Every day | ORAL | Status: DC

## 2013-10-02 MED ORDER — SODIUM CHLORIDE 0.9 % IV SOLN
1000.0000 mL | Freq: Once | INTRAVENOUS | Status: AC
  Administered 2013-10-02: 1000 mL via INTRAVENOUS

## 2013-10-02 MED ORDER — METOCLOPRAMIDE HCL 10 MG PO TABS: 10.0000 mg | ORAL_TABLET | Freq: Four times a day (QID) | ORAL | Status: DC | PRN

## 2013-10-02 MED ORDER — SODIUM CHLORIDE 0.9 % IV SOLN: 1000.0000 mL | INTRAVENOUS | Status: DC

## 2013-10-02 MED ORDER — MORPHINE SULFATE 4 MG/ML IJ SOLN
4.0000 mg | Freq: Once | INTRAMUSCULAR | Status: AC
  Administered 2013-10-02: 4 mg via INTRAVENOUS
  Filled 2013-10-02: qty 1

## 2013-10-02 MED ORDER — SODIUM CHLORIDE 0.9 % IV SOLN: 1000.0000 mL | Freq: Once | INTRAVENOUS | Status: DC

## 2013-10-02 MED ORDER — METHYLPREDNISOLONE SODIUM SUCC 125 MG IJ SOLR
125.0000 mg | Freq: Once | INTRAMUSCULAR | Status: AC
  Administered 2013-10-02: 125 mg via INTRAVENOUS
  Filled 2013-10-02: qty 2

## 2013-10-02 NOTE — ED Notes (Signed)
I stat lactic acid results given to Dr. Glick by B. Lisaann Atha, EMT 

## 2013-10-02 NOTE — ED Provider Notes (Signed)
Medical screening examination/treatment/procedure(s) were performed by non-physician practitioner and as supervising physician I was immediately available for consultation/collaboration.   EKG Interpretation None       Jhace Fennell, MD 10/02/13 0041 

## 2013-10-02 NOTE — Discharge Instructions (Signed)
Sore Throat A sore throat is pain, burning, irritation, or scratchiness of the throat. There is often pain or tenderness when swallowing or talking. A sore throat may be accompanied by other symptoms, such as coughing, sneezing, fever, and swollen neck glands. A sore throat is often the first sign of another sickness, such as a cold, flu, strep throat, or mononucleosis (commonly known as mono). Most sore throats go away without medical treatment. CAUSES  The most common causes of a sore throat include:  A viral infection, such as a cold, flu, or mono.  A bacterial infection, such as strep throat, tonsillitis, or whooping cough.  Seasonal allergies.  Dryness in the air.  Irritants, such as smoke or pollution.  Gastroesophageal reflux disease (GERD). HOME CARE INSTRUCTIONS   Only take over-the-counter medicines as directed by your caregiver.  Drink enough fluids to keep your urine clear or pale yellow.  Rest as needed.  Try using throat sprays, lozenges, or sucking on hard candy to ease any pain (if older than 4 years or as directed).  Sip warm liquids, such as broth, herbal tea, or warm water with honey to relieve pain temporarily. You may also eat or drink cold or frozen liquids such as frozen ice pops.  Gargle with salt water (mix 1 tsp salt with 8 oz of water).  Do not smoke and avoid secondhand smoke.  Put a cool-mist humidifier in your bedroom at night to moisten the air. You can also turn on a hot shower and sit in the bathroom with the door closed for 5-10 minutes. SEEK IMMEDIATE MEDICAL CARE IF:  You have difficulty breathing.  You are unable to swallow fluids, soft foods, or your saliva.  You have increased swelling in the throat.  Your sore throat does not get better in 7 days.  You have nausea and vomiting.  You have a fever or persistent symptoms for more than 2-3 days.  You have a fever and your symptoms suddenly get worse. MAKE SURE YOU:   Understand  these instructions.  Will watch your condition.  Will get help right away if you are not doing well or get worse. Document Released: 02/17/2004 Document Revised: 12/27/2011 Document Reviewed: 09/17/2011 [facility] Patient Information 2015 [place], [place]. This information is not intended to replace advice given to you by your health care provider. Make sure you discuss any questions you have with your health care provider.  Prednisone tablets What is this medicine? PREDNISONE (PRED ni sone) is a corticosteroid. It is commonly used to treat inflammation of the skin, joints, lungs, and other organs. Common conditions treated include asthma, allergies, and arthritis. It is also used for other conditions, such as blood disorders and diseases of the adrenal glands. This medicine may be used for other purposes; ask your health care provider or pharmacist if you have questions. COMMON BRAND NAME(S): Deltasone, Predone, Sterapred, Sterapred DS What should I tell my health care provider before I take this medicine? They need to know if you have any of these conditions: -Cushing's syndrome -diabetes -glaucoma -heart disease -high blood pressure -infection (especially a virus infection such as chickenpox, cold sores, or herpes) -kidney disease -liver disease -mental illness -myasthenia gravis -osteoporosis -seizures -stomach or intestine problems -thyroid disease -an unusual or allergic reaction to lactose, prednisone, other medicines, foods, dyes, or preservatives -pregnant or trying to get pregnant -breast-feeding How should I use this medicine? Take this medicine by mouth with a glass of water. Follow the directions on the prescription label.  Take this medicine with food. If you are taking this medicine once a day, take it in the morning. Do not take more medicine than you are told to take. Do not suddenly stop taking your medicine because you may develop a severe reaction. Your doctor will  tell you how much medicine to take. If your doctor wants you to stop the medicine, the dose may be slowly lowered over time to avoid any side effects. Talk to your pediatrician regarding the use of this medicine in children. Special care may be needed. Overdosage: If you think you have taken too much of this medicine contact a poison control center or emergency room at once. NOTE: This medicine is only for you. Do not share this medicine with others. What if I miss a dose? If you miss a dose, take it as soon as you can. If it is almost time for your next dose, talk to your doctor or health care professional. You may need to miss a dose or take an extra dose. Do not take double or extra doses without advice. What may interact with this medicine? Do not take this medicine with any of the following medications: -metyrapone -mifepristone This medicine may also interact with the following medications: -aminoglutethimide -amphotericin B -aspirin and aspirin-like medicines -barbiturates -certain medicines for diabetes, like glipizide or glyburide -cholestyramine -cholinesterase inhibitors -cyclosporine -digoxin -diuretics -ephedrine -female hormones, like estrogens and birth control pills -isoniazid -ketoconazole -NSAIDS, medicines for pain and inflammation, like ibuprofen or naproxen -phenytoin -rifampin -toxoids -vaccines -warfarin This list may not describe all possible interactions. Give your health care provider a list of all the medicines, herbs, non-prescription drugs, or dietary supplements you use. Also tell them if you smoke, drink alcohol, or use illegal drugs. Some items may interact with your medicine. What should I watch for while using this medicine? Visit your doctor or health care professional for regular checks on your progress. If you are taking this medicine over a prolonged period, carry an identification card with your name and address, the type and dose of your  medicine, and your doctor's name and address. This medicine may increase your risk of getting an infection. Tell your doctor or health care professional if you are around anyone with measles or chickenpox, or if you develop sores or blisters that do not heal properly. If you are going to have surgery, tell your doctor or health care professional that you have taken this medicine within the last twelve months. Ask your doctor or health care professional about your diet. You may need to lower the amount of salt you eat. This medicine may affect blood sugar levels. If you have diabetes, check with your doctor or health care professional before you change your diet or the dose of your diabetic medicine. What side effects may I notice from receiving this medicine? Side effects that you should report to your doctor or health care professional as soon as possible: -allergic reactions like skin rash, itching or hives, swelling of the face, lips, or tongue -changes in emotions or moods -changes in vision -depressed mood -eye pain -fever or chills, cough, sore throat, pain or difficulty passing urine -increased thirst -swelling of ankles, feet Side effects that usually do not require medical attention (report to your doctor or health care professional if they continue or are bothersome): -confusion, excitement, restlessness -headache -nausea, vomiting -skin problems, acne, thin and shiny skin -trouble sleeping -weight gain This list may not describe all possible side effects. Call your  doctor for medical advice about side effects. You may report side effects to FDA at 1-800-FDA-1088. Where should I keep my medicine? Keep out of the reach of children. Store at room temperature between 15 and 30 degrees C (59 and 86 degrees F). Protect from light. Keep container tightly closed. Throw away any unused medicine after the expiration date. NOTE: This sheet is a summary. It may not cover all possible  information. If you have questions about this medicine, talk to your doctor, pharmacist, or health care provider.  2015, [employer]. (2010-08-25 10:57:14)  Metoclopramide tablets What is this medicine? METOCLOPRAMIDE (met oh kloe PRA mide) is used to treat the symptoms of gastroesophageal reflux disease (GERD) like heartburn. It is also used to treat people with slow emptying of the stomach and intestinal tract. This medicine may be used for other purposes; ask your health care provider or pharmacist if you have questions. COMMON BRAND NAME(S): Reglan What should I tell my health care provider before I take this medicine? They need to know if you have any of these conditions: -breast cancer -depression -diabetes -heart failure -high blood pressure -kidney disease -liver disease -Parkinson's disease or a movement disorder -pheochromocytoma -seizures -stomach obstruction, bleeding, or perforation -an unusual or allergic reaction to metoclopramide, procainamide, sulfites, other medicines, foods, dyes, or preservatives -pregnant or trying to get pregnant -breast-feeding How should I use this medicine? Take this medicine by mouth with a glass of water. Follow the directions on the prescription label. Take this medicine on an empty stomach, about 30 minutes before eating. Take your doses at regular intervals. Do not take your medicine more often than directed. Do not stop taking except on the advice of your doctor or health care professional. A special MedGuide will be given to you by the pharmacist with each prescription and refill. Be sure to read this information carefully each time. Talk to your pediatrician regarding the use of this medicine in children. Special care may be needed. Overdosage: If you think you have taken too much of this medicine contact a poison control center or emergency room at once. NOTE: This medicine is only for you. Do not share this medicine with  others. What if I miss a dose? If you miss a dose, take it as soon as you can. If it is almost time for your next dose, take only that dose. Do not take double or extra doses. What may interact with this medicine? -acetaminophen -cyclosporine -digoxin -medicines for blood pressure -medicines for diabetes, including insulin -medicines for hay fever and other allergies -medicines for depression, especially an Monoamine Oxidase Inhibitor (MAOI) -medicines for Parkinson's disease, like levodopa -medicines for sleep or for pain -tetracycline This list may not describe all possible interactions. Give your health care provider a list of all the medicines, herbs, non-prescription drugs, or dietary supplements you use. Also tell them if you smoke, drink alcohol, or use illegal drugs. Some items may interact with your medicine. What should I watch for while using this medicine? It may take a few weeks for your stomach condition to start to get better. However, do not take this medicine for longer than 12 weeks. The longer you take this medicine, and the more you take it, the greater your chances are of developing serious side effects. If you are an elderly patient, a female patient, or you have diabetes, you may be at an increased risk for side effects from this medicine. Contact your doctor immediately if you start having movements  you cannot control such as lip smacking, rapid movements of the tongue, involuntary or uncontrollable movements of the eyes, head, arms and legs, or muscle twitches and spasms. Patients and their families should watch out for worsening depression or thoughts of suicide. Also watch out for any sudden or severe changes in feelings such as feeling anxious, agitated, panicky, irritable, hostile, aggressive, impulsive, severely restless, overly excited and hyperactive, or not being able to sleep. If this happens, especially at the beginning of treatment or after a change in dose, call  your doctor. Do not treat yourself for high fever. Ask your doctor or health care professional for advice. You may get drowsy or dizzy. Do not drive, use machinery, or do anything that needs mental alertness until you know how this drug affects you. Do not stand or sit up quickly, especially if you are an older patient. This reduces the risk of dizzy or fainting spells. Alcohol can make you more drowsy and dizzy. Avoid alcoholic drinks. What side effects may I notice from receiving this medicine? Side effects that you should report to your doctor or health care professional as soon as possible: -allergic reactions like skin rash, itching or hives, swelling of the face, lips, or tongue -abnormal production of milk in females -breast enlargement in both males and females -change in the way you walk -difficulty moving, speaking or swallowing -drooling, lip smacking, or rapid movements of the tongue -excessive sweating -fever -involuntary or uncontrollable movements of the eyes, head, arms and legs -irregular heartbeat or palpitations -muscle twitches and spasms -unusually weak or tired Side effects that usually do not require medical attention (report to your doctor or health care professional if they continue or are bothersome): -change in sex drive or performance -depressed mood -diarrhea -difficulty sleeping -headache -menstrual changes -restless or nervous This list may not describe all possible side effects. Call your doctor for medical advice about side effects. You may report side effects to FDA at 1-800-FDA-1088. Where should I keep my medicine? Keep out of the reach of children. Store at room temperature between 20 and 25 degrees C (68 and 77 degrees F). Protect from light. Keep container tightly closed. Throw away any unused medicine after the expiration date. NOTE: This sheet is a summary. It may not cover all possible information. If you have questions about this medicine, talk  to your doctor, pharmacist, or health care provider.  2015, [employer]. (2011-05-09 13:04:38)  Acetaminophen; Oxycodone tablets What is this medicine? ACETAMINOPHEN; OXYCODONE (a set a MEE noe fen; ox i KOE done) is a pain reliever. It is used to treat mild to moderate pain. This medicine may be used for other purposes; ask your health care provider or pharmacist if you have questions. COMMON BRAND NAME(S): Endocet, Magnacet, Narvox, Percocet, Perloxx, Primalev, Primlev, Roxicet, Xolox What should I tell my health care provider before I take this medicine? They need to know if you have any of these conditions: -brain tumor -Crohn's disease, inflammatory bowel disease, or ulcerative colitis -drug abuse or addiction -head injury -heart or circulation problems -if you often drink alcohol -kidney disease or problems going to the bathroom -liver disease -lung disease, asthma, or breathing problems -an unusual or allergic reaction to acetaminophen, oxycodone, other opioid analgesics, other medicines, foods, dyes, or preservatives -pregnant or trying to get pregnant -breast-feeding How should I use this medicine? Take this medicine by mouth with a full glass of water. Follow the directions on the prescription label. Take your medicine at regular  intervals. Do not take your medicine more often than directed. Talk to your pediatrician regarding the use of this medicine in children. Special care may be needed. Patients over 3 years old may have a stronger reaction and need a smaller dose. Overdosage: If you think you have taken too much of this medicine contact a poison control center or emergency room at once. NOTE: This medicine is only for you. Do not share this medicine with others. What if I miss a dose? If you miss a dose, take it as soon as you can. If it is almost time for your next dose, take only that dose. Do not take double or extra doses. What may interact with this  medicine? -alcohol -antihistamines -barbiturates like amobarbital, butalbital, butabarbital, methohexital, pentobarbital, phenobarbital, thiopental, and secobarbital -benztropine -drugs for bladder problems like solifenacin, trospium, oxybutynin, tolterodine, hyoscyamine, and methscopolamine -drugs for breathing problems like ipratropium and tiotropium -drugs for certain stomach or intestine problems like propantheline, homatropine methylbromide, glycopyrrolate, atropine, belladonna, and dicyclomine -general anesthetics like etomidate, ketamine, nitrous oxide, propofol, desflurane, enflurane, halothane, isoflurane, and sevoflurane -medicines for depression, anxiety, or psychotic disturbances -medicines for sleep -muscle relaxants -naltrexone -narcotic medicines (opiates) for pain -phenothiazines like perphenazine, thioridazine, chlorpromazine, mesoridazine, fluphenazine, prochlorperazine, promazine, and trifluoperazine -scopolamine -tramadol -trihexyphenidyl This list may not describe all possible interactions. Give your health care provider a list of all the medicines, herbs, non-prescription drugs, or dietary supplements you use. Also tell them if you smoke, drink alcohol, or use illegal drugs. Some items may interact with your medicine. What should I watch for while using this medicine? Tell your doctor or health care professional if your pain does not go away, if it gets worse, or if you have new or a different type of pain. You may develop tolerance to the medicine. Tolerance means that you will need a higher dose of the medication for pain relief. Tolerance is normal and is expected if you take this medicine for a long time. Do not suddenly stop taking your medicine because you may develop a severe reaction. Your body becomes used to the medicine. This does NOT mean you are addicted. Addiction is a behavior related to getting and using a drug for a non-medical reason. If you have pain, you  have a medical reason to take pain medicine. Your doctor will tell you how much medicine to take. If your doctor wants you to stop the medicine, the dose will be slowly lowered over time to avoid any side effects. You may get drowsy or dizzy. Do not drive, use machinery, or do anything that needs mental alertness until you know how this medicine affects you. Do not stand or sit up quickly, especially if you are an older patient. This reduces the risk of dizzy or fainting spells. Alcohol may interfere with the effect of this medicine. Avoid alcoholic drinks. There are different types of narcotic medicines (opiates) for pain. If you take more than one type at the same time, you may have more side effects. Give your health care provider a list of all medicines you use. Your doctor will tell you how much medicine to take. Do not take more medicine than directed. Call emergency for help if you have problems breathing. The medicine will cause constipation. Try to have a bowel movement at least every 2 to 3 days. If you do not have a bowel movement for 3 days, call your doctor or health care professional. Do not take Tylenol (acetaminophen) or medicines that have  acetaminophen with this medicine. Too much acetaminophen can be very dangerous. Many nonprescription medicines contain acetaminophen. Always read the labels carefully to avoid taking more acetaminophen. What side effects may I notice from receiving this medicine? Side effects that you should report to your doctor or health care professional as soon as possible: -allergic reactions like skin rash, itching or hives, swelling of the face, lips, or tongue -breathing difficulties, wheezing -confusion -light headedness or fainting spells -severe stomach pain -unusually weak or tired -yellowing of the skin or the whites of the eyes Side effects that usually do not require medical attention (report to your doctor or health care professional if they continue  or are bothersome): -dizziness -drowsiness -nausea -vomiting This list may not describe all possible side effects. Call your doctor for medical advice about side effects. You may report side effects to FDA at 1-800-FDA-1088. Where should I keep my medicine? Keep out of the reach of children. This medicine can be abused. Keep your medicine in a safe place to protect it from theft. Do not share this medicine with anyone. Selling or giving away this medicine is dangerous and against the law. Store at room temperature between 20 and 25 degrees C (68 and 77 degrees F). Keep container tightly closed. Protect from light. This medicine may cause accidental overdose and death if it is taken by other adults, children, or pets. Flush any unused medicine down the toilet to reduce the chance of harm. Do not use the medicine after the expiration date. NOTE: This sheet is a summary. It may not cover all possible information. If you have questions about this medicine, talk to your doctor, pharmacist, or health care provider.  2015, [employer]. (2012-09-02 13:17:35)

## 2013-10-02 NOTE — ED Notes (Signed)
Pt arrives with c/o sore throat worsening, chest wall pain (burning sensation), seen yesterday for same. Pt's friend states she passed out this evening in the car.

## 2013-10-02 NOTE — ED Provider Notes (Signed)
CSN: [id number]     Arrival date & time 10/02/13  0125 History   First MD Initiated Contact with Patient 10/02/13 0146     Chief Complaint  Patient presents with  . Sore Throat     (Consider location/radiation/quality/duration/timing/severity/associated sxs/prior Treatment) The history is provided by the patient.   33 year old female started having a sore throat for about 4 days ago. She is having pain with swallowing along with subjective fever and chills. She was seen in the ED yesterday and was treated for strep with intravascular benzathine penicillin G. She has not improved since then and today, she noted burning pain that seemed to go around both of her ears and down into her chest. She didn't pass out on one occasion. There has been some nausea and vomiting. Her friend who is with her is concerned because she looked up her symptoms on tubal and was worried that there was poison going to her bloodstream. In addition to above-noted symptoms, she has had some dry mouth and some tingling in her hands. She has no known sick contacts.  Past Medical History  Diagnosis Date  . Hypertension   . Obesity   . Knee pain   . Arthritis     bil knees   Past Surgical History  Procedure Laterality Date  . Cholecystectomy    . Cesarean section    . Dilation and curettage of uterus N/A 09/04/2012    Procedure: DILATATION AND CURETTAGE, Hysteroscopy;  Surgeon: Tereso Newcomer, MD;  Location: [facility];  Service: Gynecology;  Laterality: N/A;   Family History  Problem Relation Age of Onset  . Diabetes Maternal Aunt   . Cancer Maternal Aunt   . Diabetes Maternal Grandmother   . Cancer Paternal Grandmother    History  Substance Use Topics  . Smoking status: Current Every Day Smoker -- 0.50 packs/day    Types: Cigarettes  . Smokeless tobacco: Not on file  . Alcohol Use: No   OB History   Grav Para Term Preterm Abortions TAB SAB Ect Mult Living   0 0 0 0 0 2     Review of Systems   All other systems reviewed and are negative.     Allergies  Toradol  Home Medications   Prior to Admission medications   Medication Sig Start Date End Date Taking? Authorizing Provider  HYDROcodone-acetaminophen (HYCET) 7.5-325 mg/15 ml solution Take 15 mLs by mouth every 4 (four) hours as needed for moderate pain or severe pain. 09/30/13   Mellody Drown, PA-C  ibuprofen (ADVIL,MOTRIN) 200 MG tablet Take 200 mg by mouth every 6 (six) hours as needed for moderate pain.    Historical Provider, MD   BP 121/70  Pulse 83  Temp(Src) 98.1 F (36.7 C) (Oral)  Resp 17  Ht  (1.753 m)  Wt 350 lb (158.759 kg)  BMI 51.66 kg/m2  SpO2 97%  LMP 09/30/2013 Physical Exam  Nursing note and vitals reviewed.  Morbidly obese 33 year old female, resting comfortably and in no acute distress. Vital signs are significant for tachypnea. Oxygen saturation is 97%, which is normal. Head is normocephalic and atraumatic. PERRLA, EOMI. TMs are clear. Oropharynx shows tonsillar erythema with mild hypertrophy and moderate exudate. There is no swelling to suggest peritonsillar abscess. Uvula is in the midline without any retraction. She is not having any difficulty with her secretions and phonation is normal. Neck is nontender and supple. There is a moderately tender 1.5 cm left  submandibular lymph node present. Back is nontender and there is no CVA tenderness. Lungs are clear without rales, wheezes, or rhonchi. Chest is nontender. Heart has regular rate and rhythm without murmur. Abdomen is soft, flat, nontender without masses or hepatosplenomegaly and peristalsis is normoactive. Extremities have no cyanosis or edema, full range of motion is present. Skin is warm and dry without rash. Neurologic: Mental status is normal, cranial nerves are intact, there are no motor or sensory deficits.  ED Course  Procedures (including critical care time) Labs Review Results for orders placed during the hospital  encounter of 10/02/13  CBC WITH DIFFERENTIAL      Result Value Ref Range   WBC 10.2  4.0 - 10.5 K/uL   RBC 4.43  3.87 - 5.11 MIL/uL   Hemoglobin 9.7 (*) 12.0 - 15.0 g/dL   HCT 96.0 (*) 45.4 - 09.8 %   MCV 67.0 (*) 78.0 - 100.0 fL   MCH 21.9 (*) 26.0 - 34.0 pg   MCHC 32.7  30.0 - 36.0 g/dL   RDW 11.9 (*) 14.7 - 82.9 %   Platelets 190  150 - 400 K/uL   Neutrophils Relative % 72  43 - 77 %   Neutro Abs 7.4  1.7 - 7.7 K/uL   Lymphocytes Relative 20  12 - 46 %   Lymphs Abs 2.1  0.7 - 4.0 K/uL   Monocytes Relative 7  3 - 12 %   Monocytes Absolute 0.7  0.1 - 1.0 K/uL   Eosinophils Relative 0  0 - 5 %   Eosinophils Absolute 0.0  0.0 - 0.7 K/uL   Basophils Relative 0  0 - 1 %   Basophils Absolute 0.0  0.0 - 0.1 K/uL  COMPREHENSIVE METABOLIC PANEL      Result Value Ref Range   Sodium 141  137 - 147 mEq/L   Potassium 3.2 (*) 3.7 - 5.3 mEq/L   Chloride 101  96 - 112 mEq/L   CO2 26  19 - 32 mEq/L   Glucose, Bld 121 (*) 70 - 99 mg/dL   BUN 10  6 - 23 mg/dL   Creatinine, Ser 5.62  0.50 - 1.10 mg/dL   Calcium 9.0  8.4 - 13.0 mg/dL   Total Protein 7.4  6.0 - 8.3 g/dL   Albumin 3.1 (*) 3.5 - 5.2 g/dL   AST 19  0 - 37 U/L   ALT 15  0 - 35 U/L   Alkaline Phosphatase 67  39 - 117 U/L   Total Bilirubin <0.2 (*) 0.3 - 1.2 mg/dL   GFR calc non Af Amer >90  >90 mL/min   GFR calc Af Amer >90  >90 mL/min   Anion gap 14  5 - 15  MONONUCLEOSIS SCREEN      Result Value Ref Range   Mono Screen NEGATIVE  NEGATIVE  TROPONIN I      Result Value Ref Range   Troponin I <0.30  <0.30 ng/mL  I-STAT CG4 LACTIC ACID, ED      Result Value Ref Range   Lactic Acid, Venous 1.32  0.5 - 2.2 mmol/L     Imaging Review Dg Chest 2 View  10/02/2013   CLINICAL DATA:  Sore throat.  Year aches.  Body aches for 5 days.  EXAM: CHEST  2 VIEW  COMPARISON:  01/11/2013  FINDINGS: The heart size and mediastinal contours are within normal limits. Both lungs are clear. The visualized skeletal structures are unremarkable.   IMPRESSION: No  active cardiopulmonary disease.   Electronically Signed   By: Burman Nieves M.D.   On: 10/02/2013 02:37     EKG Interpretation   Date/Time:  Thursday October 02 2013 01:34:01 EDT Ventricular Rate:  82 PR Interval:  153 QRS Duration: 95 QT Interval:  377 QTC Calculation: 440 R Axis:   53 Text Interpretation:  Sinus rhythm Borderline T abnormalities, anterior  leads When compared with ECG of 09/04/2013, No significant change was found  Confirmed by [facility]  MD, Blessings Inglett ([id number]) on 10/02/2013 1:42:11 AM      MDM   Final diagnoses:  Tonsillitis    Tonsillitis which has not responded to penicillin treatment. Old records are reviewed and she actually had a negative strep screen and initial strep culture is also negative. She had been given a dose of. She is extremely anxious and I suspect that many of her ancillary symptoms are related to hyperventilation. ECG is unremarkable. Chest x-ray will be obtained as well screening labs and she'll be given IV hydration as well as methylprednisolone.  She feels much better after above noted treatment. Laboratory workup is negative including normal lactic acid, the negative Monospot, and normal CBC. Metabolic panel is also normal. At this point, I do feel that most of her pain in her ears and neck and chest axial related to hyperventilation. No evidence of bacterial infection. She is reassured and is discharged with prescription for prednisone, metoclopramide, and oxycodone and acetaminophen.  Dione Booze, MD 10/02/13 [phone]

## 2013-10-04 ENCOUNTER — Emergency Department (HOSPITAL_COMMUNITY)
Admission: EM | Admit: 2013-10-04 | Discharge: 2013-10-04 | Disposition: A | Discharge status: Home / Self Care | Payer: Self-pay | Attending: Emergency Medicine | Admitting: Emergency Medicine

## 2013-10-04 ENCOUNTER — Encounter (HOSPITAL_COMMUNITY): Payer: Self-pay | Admitting: Emergency Medicine

## 2013-10-04 DIAGNOSIS — H9209 Otalgia, unspecified ear: Secondary | ICD-10-CM | POA: Insufficient documentation

## 2013-10-04 DIAGNOSIS — I10 Essential (primary) hypertension: Secondary | ICD-10-CM | POA: Insufficient documentation

## 2013-10-04 DIAGNOSIS — Z79899 Other long term (current) drug therapy: Secondary | ICD-10-CM | POA: Insufficient documentation

## 2013-10-04 DIAGNOSIS — IMO0002 Reserved for concepts with insufficient information to code with codable children: Secondary | ICD-10-CM | POA: Insufficient documentation

## 2013-10-04 DIAGNOSIS — J029 Acute pharyngitis, unspecified: Secondary | ICD-10-CM | POA: Insufficient documentation

## 2013-10-04 DIAGNOSIS — M1710 Unilateral primary osteoarthritis, unspecified knee: Secondary | ICD-10-CM | POA: Insufficient documentation

## 2013-10-04 DIAGNOSIS — H9202 Otalgia, left ear: Secondary | ICD-10-CM

## 2013-10-04 MED ORDER — AMOXICILLIN-POT CLAVULANATE 250-62.5 MG/5ML PO SUSR: 500.0000 mg | Freq: Three times a day (TID) | ORAL | Status: DC

## 2013-10-04 MED ORDER — ACETAMINOPHEN 325 MG PO TABS
650.0000 mg | ORAL_TABLET | Freq: Once | ORAL | Status: AC
  Administered 2013-10-04: 650 mg via ORAL

## 2013-10-04 NOTE — ED Notes (Signed)
The pt has had a sore throat for 7 days.  She has been diagnosed with strep throat once and another time diagnosed with  Another problem she cannot remember.  She has been unable to eat and she wants her tonsils removed.

## 2013-10-04 NOTE — ED Provider Notes (Signed)
CSN: [id number]     Arrival date & time 10/04/13  1953 History   First MD Initiated Contact with Patient 10/04/13 2042     Chief Complaint  Patient presents with  . Sore Throat     (Consider location/radiation/quality/duration/timing/severity/associated sxs/prior Treatment) HPI Comments: Patient presents to the ED with a chief complaint of persistent sore throat.  She has been seen her twice in the last week for the same.  She was treated with penicillin IM on her initial eval despite a negative strep.  She has reported subjective fevers, chills, worsening sore throat, and earache.  She has tried taking prednisone, reglan, and percocet with no relief.  She states that her symptoms worsen when she swallows.  She states that she has been unable to eat because of pain.  The history is provided by the patient. No language interpreter was used.    Past Medical History  Diagnosis Date  . Hypertension   . Obesity   . Knee pain   . Arthritis     bil knees   Past Surgical History  Procedure Laterality Date  . Cholecystectomy    . Cesarean section    . Dilation and curettage of uterus N/A 09/04/2012    Procedure: DILATATION AND CURETTAGE, Hysteroscopy;  Surgeon: Tereso Newcomer, MD;  Location: [facility];  Service: Gynecology;  Laterality: N/A;   Family History  Problem Relation Age of Onset  . Diabetes Maternal Aunt   . Cancer Maternal Aunt   . Diabetes Maternal Grandmother   . Cancer Paternal Grandmother    History  Substance Use Topics  . Smoking status: Current Every Day Smoker -- 0.50 packs/day    Types: Cigarettes  . Smokeless tobacco: Not on file  . Alcohol Use: No   OB History   Grav Para Term Preterm Abortions TAB SAB Ect Mult Living   0 0 0 0 0 2     Review of Systems  Constitutional: Positive for fever and chills.  HENT: Positive for ear pain and sore throat. Negative for trouble swallowing and voice change.   Respiratory: Negative for shortness of breath.    Cardiovascular: Negative for chest pain.  Gastrointestinal: Negative for nausea, vomiting, diarrhea and constipation.  Genitourinary: Negative for dysuria.      Allergies  Toradol  Home Medications   Prior to Admission medications   Medication Sig Start Date End Date Taking? Authorizing Provider  HYDROcodone-acetaminophen (HYCET) 7.5-325 mg/15 ml solution Take 15 mLs by mouth every 4 (four) hours as needed for moderate pain or severe pain. 09/30/13   Mellody Drown, PA-C  metoCLOPramide (REGLAN) 10 MG tablet Take 1 tablet (10 mg total) by mouth every 6 (six) hours as needed for nausea. 10/02/13   Dione Booze, MD  oxyCODONE-acetaminophen (PERCOCET) 5-325 MG per tablet Take 1 tablet by mouth every 4 (four) hours as needed for moderate pain. 10/02/13   Dione Booze, MD  oxyCODONE-acetaminophen (PERCOCET/ROXICET) 5-325 MG per tablet Take 1 tablet by mouth once.    Historical Provider, MD  predniSONE (DELTASONE) 20 MG tablet Take 3 tablets (60 mg total) by mouth daily. 10/02/13   Dione Booze, MD   BP 126/73  Pulse 94  Temp(Src) 101.1 F (38.4 C) (Oral)  Resp 20  Ht  (1.702 m)  Wt 350 lb (158.759 kg)  BMI 54.80 kg/m2  SpO2 94%  LMP 09/30/2013 Physical Exam  Nursing note and vitals reviewed. Constitutional: She is oriented to person, place, and time.  She appears well-developed and well-nourished.  Morbidly obese  HENT:  Head: Normocephalic and atraumatic.  Oropharynx is erythematous with some tonsillar exudates, no evidence of abscess, uvula is midline, airway is intact, no stridor  TMs are mildly erythematous without effusion   Eyes: Conjunctivae and EOM are normal. Pupils are equal, round, and reactive to light.  Neck: Normal range of motion. Neck supple.  Cardiovascular: Normal rate and regular rhythm.  Exam reveals no gallop and no friction rub.   No murmur heard. Pulmonary/Chest: Effort normal and breath sounds normal. No respiratory distress. She has no wheezes. She has no  rales. She exhibits no tenderness.  Abdominal: Soft. She exhibits no distension and no mass. There is no tenderness. There is no rebound and no guarding.  Musculoskeletal: Normal range of motion. She exhibits no edema and no tenderness.  Neurological: She is alert and oriented to person, place, and time.  Skin: Skin is warm and dry.  Psychiatric: She has a normal mood and affect. Her behavior is normal. Judgment and thought content normal.    ED Course  Procedures (including critical care time) Labs Review Labs Reviewed - No data to display  Imaging Review No results found.   EKG Interpretation None      MDM   Final diagnoses:  Pharyngitis  Earache on left    Patient with pharyngitis and ear pain.  She is oxygenating well, is not hyperventilating, but does seem anxious. She was treated with penicillin IM a week ago with no relief.  She continues to have fevers and earaches.  She could have a developing otitis.  As the patient has been sick for 1 week with persistent fevers, I will treat with augmentin.  She was seen two days ago and normal labs and CXR.  Symptoms have remained the same.  Recommend PCP follow-up.  Filed Vitals:   10/04/13 2123  BP: 141/80  Pulse: 90  Temp: 100.4 F (38 C)  Resp: [address], PA-C 10/04/13 2130  Roxy Horseman, PA-C 10/04/13 2130

## 2013-10-04 NOTE — Discharge Instructions (Signed)

## 2013-10-04 NOTE — ED Notes (Signed)
The pt is anxious and hyperventilating in triage

## 2013-10-05 NOTE — ED Provider Notes (Signed)
Medical screening examination/treatment/procedure(s) were performed by non-physician practitioner and as supervising physician I was immediately available for consultation/collaboration.   EKG Interpretation None        Audreyanna Butkiewicz, MD 10/05/13 1648 

## 2013-11-07 ENCOUNTER — Encounter: Payer: Self-pay | Admitting: Obstetrics and Gynecology

## 2013-11-24 ENCOUNTER — Encounter (HOSPITAL_COMMUNITY): Payer: Self-pay | Admitting: Emergency Medicine

## 2014-01-28 ENCOUNTER — Encounter (HOSPITAL_COMMUNITY): Payer: Self-pay | Admitting: Emergency Medicine

## 2014-01-28 ENCOUNTER — Emergency Department (HOSPITAL_COMMUNITY)
Admission: EM | Admit: 2014-01-28 | Discharge: 2014-01-28 | Disposition: A | Discharge status: Home / Self Care | Payer: Self-pay | Attending: Emergency Medicine | Admitting: Emergency Medicine

## 2014-01-28 DIAGNOSIS — Z720 Tobacco use: Secondary | ICD-10-CM | POA: Insufficient documentation

## 2014-01-28 DIAGNOSIS — Z7952 Long term (current) use of systemic steroids: Secondary | ICD-10-CM | POA: Insufficient documentation

## 2014-01-28 DIAGNOSIS — K0889 Other specified disorders of teeth and supporting structures: Secondary | ICD-10-CM

## 2014-01-28 DIAGNOSIS — K088 Other specified disorders of teeth and supporting structures: Secondary | ICD-10-CM | POA: Insufficient documentation

## 2014-01-28 DIAGNOSIS — M170 Bilateral primary osteoarthritis of knee: Secondary | ICD-10-CM | POA: Insufficient documentation

## 2014-01-28 DIAGNOSIS — I10 Essential (primary) hypertension: Secondary | ICD-10-CM | POA: Insufficient documentation

## 2014-01-28 DIAGNOSIS — K0381 Cracked tooth: Secondary | ICD-10-CM | POA: Insufficient documentation

## 2014-01-28 DIAGNOSIS — K029 Dental caries, unspecified: Secondary | ICD-10-CM | POA: Insufficient documentation

## 2014-01-28 DIAGNOSIS — E669 Obesity, unspecified: Secondary | ICD-10-CM | POA: Insufficient documentation

## 2014-01-28 DIAGNOSIS — Z792 Long term (current) use of antibiotics: Secondary | ICD-10-CM | POA: Insufficient documentation

## 2014-01-28 MED ORDER — HYDROCODONE-ACETAMINOPHEN 5-325 MG PO TABS: 1.0000 | ORAL_TABLET | Freq: Four times a day (QID) | ORAL | Status: DC | PRN

## 2014-01-28 MED ORDER — OXYCODONE-ACETAMINOPHEN 5-325 MG PO TABS
1.0000 | ORAL_TABLET | Freq: Once | ORAL | Status: AC
  Administered 2014-01-28: 1 via ORAL
  Filled 2014-01-28: qty 1

## 2014-01-28 NOTE — ED Provider Notes (Signed)
CSN: [id number]     Arrival date & time 01/28/14  2218 History  This chart was scribed for non-physician practitioner working with Mirian MoMatthew Gentry, MD by Elveria Risingimelie Horne, ED Scribe. This patient was seen in room TR09C/TR09C and the patient's care was started at 10:31 PM.   Chief Complaint  Patient presents with  . Dental Pain   HPI Comments: April Warner is a 34 y.o. female who presents to the Emergency Department complaining of worsening, aching right, upper, rear dental pain, ongoing for three weeks. Patient reports fracturing the tooth when eating three weeks ago and pain since. Patient reports treatment with [employer] and naproxen, but denies relief. Patient does not a dentist.    The history is provided by the patient. No language interpreter was used.    Past Medical History  Diagnosis Date  . Hypertension   . Obesity   . Knee pain   . Arthritis     bil knees   Past Surgical History  Procedure Laterality Date  . Cholecystectomy    . Cesarean section    . Dilation and curettage of uterus N/A 09/04/2012    Procedure: DILATATION AND CURETTAGE, Hysteroscopy;  Surgeon: Tereso NewcomerUgonna A Anyanwu, MD;  Location: [facility];  Service: Gynecology;  Laterality: N/A;   Family History  Problem Relation Age of Onset  . Diabetes Maternal Aunt   . Cancer Maternal Aunt   . Diabetes Maternal Grandmother   . Cancer Paternal Grandmother    History  Substance Use Topics  . Smoking status: Current Every Day Smoker -- 0.50 packs/day    Types: Cigarettes  . Smokeless tobacco: Not on file  . Alcohol Use: No   OB History    Gravida Para Term Preterm AB TAB SAB Ectopic Multiple Living   3 3 2 1  0 0 0 0 0 2     Review of Systems  Constitutional: Negative for fever and chills.  HENT: Positive for dental problem. Negative for facial swelling, sore throat and trouble swallowing.    Allergies  Toradol  Home Medications   Prior to Admission medications   Medication Sig Start Date End Date Taking?  Authorizing Provider  amoxicillin-clavulanate (AUGMENTIN) 250-62.5 MG/5ML suspension Take 10 mLs (500 mg total) by mouth 3 (three) times daily. 10/04/13   Roxy Horsemanobert Browning, PA-C  HYDROcodone-acetaminophen (HYCET) 7.5-325 mg/15 ml solution Take 15 mLs by mouth every 4 (four) hours as needed for moderate pain or severe pain. 09/30/13   Mellody DrownLauren Rosanne Wohlfarth, PA-C  metoCLOPramide (REGLAN) 10 MG tablet Take 1 tablet (10 mg total) by mouth every 6 (six) hours as needed for nausea. 10/02/13   Dione Boozeavid Glick, MD  oxyCODONE-acetaminophen (PERCOCET) 5-325 MG per tablet Take 1 tablet by mouth every 4 (four) hours as needed for moderate pain. 10/02/13   Dione Boozeavid Glick, MD  oxyCODONE-acetaminophen (PERCOCET/ROXICET) 5-325 MG per tablet Take 1 tablet by mouth once.    Historical Provider, MD  predniSONE (DELTASONE) 20 MG tablet Take 3 tablets (60 mg total) by mouth daily. 10/02/13   Dione Boozeavid Glick, MD   BP 160/87 mmHg  Pulse 81  Temp(Src) 97.6 F (36.4 C) (Oral)  Resp 22  SpO2 99% Physical Exam  Constitutional: She is oriented to person, place, and time. She appears well-developed and well-nourished. No distress.  HENT:  Head: Normocephalic and atraumatic.  Mouth/Throat: Uvula is midline. No trismus in the jaw. Abnormal dentition. Dental caries present.  Tenderness to palpation of the base of tooth # 1. Large cavity to the buccal  surface. No signs of peritonsillar or tonsillar abscess. No signs of gingival abscess. Oropharynx is clear and without exudates. Soft non-tender sublingual mucosa, no tongue elevation, no edema to sublingual space, normal voice. Airway patent.   Eyes: EOM are normal.  Neck: Neck supple. No tracheal deviation present.  Pulmonary/Chest: Effort normal. No respiratory distress.  Musculoskeletal: Normal range of motion.  Neurological: She is alert and oriented to person, place, and time.  Skin: Skin is warm and dry.  Psychiatric: She has a normal mood and affect. Her behavior is normal.  Nursing note  and vitals reviewed.   ED Course  Procedures (including critical care time)  COORDINATION OF CARE: 10:31 PM- Discussed treatment plan with patient at bedside and patient agreed to plan.   Labs Review Labs Reviewed - No data to display  Imaging Review No results found.   EKG Interpretation None      MDM   Final diagnoses:  Pain, dental   Patient with toothache.  No gross abscess.  Exam unconcerning for Ludwig's angina or spread of infection.  Will treat with pain medicine.  Urged patient to follow-up with dentist.    I personally performed the services described in this documentation, which was scribed in my presence. The recorded information has been reviewed and is accurate.    Mellody Drown, PA-C 01/28/14 8119  Mirian Mo, MD 01/30/14 [phone]

## 2014-01-28 NOTE — ED Notes (Signed)
Patient is alert and orientedx4.  Patient was explained discharge instructions and they understood them with no questions.  The patient's friend Jonny RuizJohn is taking her home.

## 2014-01-28 NOTE — Discharge Instructions (Signed)
Call for a follow up appointment with a Dentist. Return if Symptoms worsen.   Take medication as prescribed.

## 2014-01-28 NOTE — ED Notes (Signed)
Pt. reports worsening right upper dental pain for several weeks .

## 2014-03-02 ENCOUNTER — Encounter (HOSPITAL_COMMUNITY): Payer: Self-pay | Admitting: Emergency Medicine

## 2014-03-02 DIAGNOSIS — E669 Obesity, unspecified: Secondary | ICD-10-CM | POA: Insufficient documentation

## 2014-03-02 DIAGNOSIS — M1990 Unspecified osteoarthritis, unspecified site: Secondary | ICD-10-CM | POA: Insufficient documentation

## 2014-03-02 DIAGNOSIS — M25561 Pain in right knee: Secondary | ICD-10-CM | POA: Insufficient documentation

## 2014-03-02 DIAGNOSIS — Z792 Long term (current) use of antibiotics: Secondary | ICD-10-CM | POA: Insufficient documentation

## 2014-03-02 DIAGNOSIS — I10 Essential (primary) hypertension: Secondary | ICD-10-CM | POA: Insufficient documentation

## 2014-03-02 DIAGNOSIS — Z720 Tobacco use: Secondary | ICD-10-CM | POA: Insufficient documentation

## 2014-03-02 DIAGNOSIS — M545 Low back pain: Secondary | ICD-10-CM | POA: Insufficient documentation

## 2014-03-02 DIAGNOSIS — Z7952 Long term (current) use of systemic steroids: Secondary | ICD-10-CM | POA: Insufficient documentation

## 2014-03-02 DIAGNOSIS — G8929 Other chronic pain: Secondary | ICD-10-CM | POA: Insufficient documentation

## 2014-03-02 NOTE — ED Notes (Signed)
Pt. reports chronic right knee and low back pain for several months , denies recent injury or fall, no urinary discomfort /no fever or chills. Ambulatory , pain increases with movement and certain positions .

## 2014-03-03 ENCOUNTER — Emergency Department (HOSPITAL_COMMUNITY)
Admission: EM | Admit: 2014-03-03 | Discharge: 2014-03-03 | Disposition: A | Discharge status: Home / Self Care | Payer: Self-pay | Attending: Emergency Medicine | Admitting: Emergency Medicine

## 2014-03-03 DIAGNOSIS — M545 Low back pain, unspecified: Secondary | ICD-10-CM

## 2014-03-03 DIAGNOSIS — G8929 Other chronic pain: Secondary | ICD-10-CM

## 2014-03-03 DIAGNOSIS — M25561 Pain in right knee: Secondary | ICD-10-CM

## 2014-03-03 MED ORDER — MELOXICAM 15 MG PO TABS
15.0000 mg | ORAL_TABLET | Freq: Once | ORAL | Status: AC
  Administered 2014-03-03: 15 mg via ORAL
  Filled 2014-03-03: qty 1

## 2014-03-03 MED ORDER — MELOXICAM 15 MG PO TABS: 15.0000 mg | ORAL_TABLET | Freq: Every day | ORAL | Status: DC

## 2014-03-03 NOTE — ED Provider Notes (Signed)
CSN: [id number]     Arrival date & time 03/02/14  2259 History   First MD Initiated Contact with Patient 03/03/14 0010     Chief Complaint  Patient presents with  . Knee Pain  . Back Pain     (Consider location/radiation/quality/duration/timing/severity/associated sxs/prior Treatment) The history is provided by the patient and medical records. No language interpreter was used.     April Warner is a 34 y.o. female  with a hx of HTN, obesity presents to the Emergency Department complaining of gradual, persistent, progressively worsening right knee pain and bilateral low back pain onset several months ago. Patient reports that she has been seen by you do not orthopedics before for cortisone shots but it became too expensive and she stopped going.  He reports that she is supposed to have an MRI but this was also too expensive. Patient reports taking ibuprofen and aspirin without relief of her pain. She reports her pain is worse with walking and standing for long periods of time. She denies injury, falls, car accidents, lifting or pulling. She denies numbness, weakness, gait disturbance.    Past Medical History  Diagnosis Date  . Hypertension   . Obesity   . Knee pain   . Arthritis     bil knees   Past Surgical History  Procedure Laterality Date  . Cholecystectomy    . Cesarean section    . Dilation and curettage of uterus N/A 09/04/2012    Procedure: DILATATION AND CURETTAGE, Hysteroscopy;  Surgeon: Tereso Newcomer, MD;  Location: [facility];  Service: Gynecology;  Laterality: N/A;   Family History  Problem Relation Age of Onset  . Diabetes Maternal Aunt   . Cancer Maternal Aunt   . Diabetes Maternal Grandmother   . Cancer Paternal Grandmother    History  Substance Use Topics  . Smoking status: Current Every Day Smoker -- 0.50 packs/day    Types: Cigarettes  . Smokeless tobacco: Not on file  . Alcohol Use: No   OB History    Gravida Para Term Preterm AB TAB SAB Ectopic  Multiple Living   0 0 0 0 0 2     Review of Systems  Constitutional: Negative for fever and chills.  Gastrointestinal: Negative for nausea and vomiting.  Musculoskeletal: Positive for back pain (low) and arthralgias. Negative for joint swelling, neck pain and neck stiffness.  Skin: Negative for wound.  Neurological: Negative for numbness.  Hematological: Does not bruise/bleed easily.  Psychiatric/Behavioral: The patient is not nervous/anxious.   All other systems reviewed and are negative.     Allergies  Toradol  Home Medications   Prior to Admission medications   Medication Sig Start Date End Date Taking? Authorizing Provider  amoxicillin-clavulanate (AUGMENTIN) 250-62.5 MG/5ML suspension Take 10 mLs (500 mg total) by mouth 3 (three) times daily. 10/04/13   Roxy Horseman, PA-C  HYDROcodone-acetaminophen (NORCO/VICODIN) 5-325 MG per tablet Take 1 tablet by mouth every 6 (six) hours as needed for moderate pain or severe pain. 01/28/14   Mellody Drown, PA-C  meloxicam (MOBIC) 15 MG tablet Take 1 tablet (15 mg total) by mouth daily. 03/03/14   Niara Bunker, PA-C  metoCLOPramide (REGLAN) 10 MG tablet Take 1 tablet (10 mg total) by mouth every 6 (six) hours as needed for nausea. 10/02/13   Dione Booze, MD  oxyCODONE-acetaminophen (PERCOCET) 5-325 MG per tablet Take 1 tablet by mouth every 4 (four) hours as needed for moderate pain. 10/02/13   Dione Booze,  MD  oxyCODONE-acetaminophen (PERCOCET/ROXICET) 5-325 MG per tablet Take 1 tablet by mouth once.    Historical Provider, MD  predniSONE (DELTASONE) 20 MG tablet Take 3 tablets (60 mg total) by mouth daily. 10/02/13   Dione Booze, MD   BP 153/87 mmHg  Pulse 74  Temp(Src) 98.3 F (36.8 C) (Oral)  Resp 22  Ht  (1.753 m)  Wt 350 lb (158.759 kg)  BMI 51.66 kg/m2  SpO2 96% Physical Exam  Constitutional: She appears well-developed and well-nourished. No distress.  HENT:  Head: Normocephalic and atraumatic.   Mouth/Throat: Oropharynx is clear and moist. No oropharyngeal exudate.  Eyes: Conjunctivae are normal.  Neck: Normal range of motion. Neck supple.  Full ROM without pain  Cardiovascular: Normal rate, regular rhythm, normal heart sounds and intact distal pulses.   No murmur heard. Capillary refill < 3 sec  Pulmonary/Chest: Effort normal and breath sounds normal. No respiratory distress. She has no wheezes.  Abdominal: Soft. She exhibits no distension. There is no tenderness.  Musculoskeletal: She exhibits tenderness. She exhibits no edema.  ROM: Range of motion of the right knee, right hip and right ankle Full range of motion of the T-spine and L-spine No pain to palpation of the spinous processes of the T-spine or L-spine Mild pain to palpation of the bilateral lumbar paraspinal muscles No obvious joint effusion of the right knee  Lymphadenopathy:    She has no cervical adenopathy.  Neurological: She is alert. She has normal reflexes. Coordination normal.  Reflex Scores:      Bicep reflexes are 2+ on the right side and 2+ on the left side.      Brachioradialis reflexes are 2+ on the right side and 2+ on the left side.      Patellar reflexes are 2+ on the right side and 2+ on the left side.      Achilles reflexes are 2+ on the right side and 2+ on the left side. Speech is clear and goal oriented, follows commands Normal 5/5 strength in upper and lower extremities bilaterally including dorsiflexion and plantar flexion, strong and equal grip strength Sensation normal to light and sharp touch Moves extremities without ataxia, coordination intact Normal gait Normal balance No Clonus   Skin: Skin is warm and dry. No rash noted. She is not diaphoretic. No erythema.  No tenting of the skin  Psychiatric: She has a normal mood and affect. Her behavior is normal.  Nursing note and vitals reviewed.   ED Course  Procedures (including critical care time) Labs Review Labs Reviewed - No  data to display  Imaging Review No results found.   EKG Interpretation None      MDM   Final diagnoses:  Chronic knee pain, right  Chronic low back pain  Bilateral low back pain without sciatica   April Warner presents with acute exacerbation of her chronic low back pain and right knee pain. Patient stopped seeing orthopedics due to financial reasons and has been taking ibuprofen at home without relief.  Walking makes her symptoms worse.  No known injury, no indication for x-ray.  Patient with back pain.  No neurological deficits and normal neuro exam.  Patient can walk but states is painful.  No loss of bowel or bladder control.  No concern for cauda equina.  No fever, night sweats, weight loss, h/o cancer, IVDU.  RICE protocol and pain medicine indicated and discussed with patient.   I have personally reviewed patient's vitals, nursing note and  any pertinent labs or imaging.  I performed an focused physical exam; undressed when appropriate .    It has been determined that no acute conditions requiring further emergency intervention are present at this time. The patient/guardian have been advised of the diagnosis and plan. I reviewed any labs and imaging including any potential incidental findings. We have discussed signs and symptoms that warrant return to the ED and they are listed in the discharge instructions.    Vital signs are stable at discharge.   BP 153/87 mmHg  Pulse 74  Temp(Src) 98.3 F (36.8 C) (Oral)  Resp 22  Ht 5\' 9"  (1.753 m)  Wt 350 lb (158.759 kg)  BMI 51.66 kg/m2  SpO2 96%        Dierdre ForthHannah Kolby Schara, PA-C 03/03/14 0127  Gwyneth SproutWhitney Plunkett, MD 03/03/14 0505

## 2014-03-03 NOTE — ED Notes (Signed)
Patient is alert and orientedx4.  Patient was explained discharge instructions and they understood them with no questions.   

## 2014-03-03 NOTE — Discharge Instructions (Signed)
1. Medications: Mobic, usual home medications 2. Treatment: rest, drink plenty of fluids, Ace wrap or knee brace 3. Follow Up: Please followup with your primary doctor and or orthopedics in 7 days for discussion of your diagnoses and further evaluation after today's visit; if you do not have a primary care doctor use the resource guide provided to find one; Please return to the ER for weakness, numbness, difficulty walking or other concerning symptoms.     Arthralgia Your caregiver has diagnosed you as suffering from an arthralgia. Arthralgia means there is pain in a joint. This can come from many reasons including:  Bruising the joint which causes soreness (inflammation) in the joint.  Wear and tear on the joints which occur as we grow older (osteoarthritis).  Overusing the joint.  Various forms of arthritis.  Infections of the joint. Regardless of the cause of pain in your joint, most of these different pains respond to anti-inflammatory drugs and rest. The exception to this is when a joint is infected, and these cases are treated with antibiotics, if it is a bacterial infection. HOME CARE INSTRUCTIONS   Rest the injured area for as long as directed by your caregiver. Then slowly start using the joint as directed by your caregiver and as the pain allows. Crutches as directed may be useful if the ankles, knees or hips are involved. If the knee was splinted or casted, continue use and care as directed. If an stretchy or elastic wrapping bandage has been applied today, it should be removed and re-applied every 3 to 4 hours. It should not be applied tightly, but firmly enough to keep swelling down. Watch toes and feet for swelling, bluish discoloration, coldness, numbness or excessive pain. If any of these problems (symptoms) occur, remove the ace bandage and re-apply more loosely. If these symptoms persist, contact your caregiver or return to this location.  For the first 24 hours, keep the  injured extremity elevated on pillows while lying down.  Apply ice for 15-20 minutes to the sore joint every couple hours while awake for the first half day. Then 03-04 times per day for the first 48 hours. Put the ice in a plastic bag and place a towel between the bag of ice and your skin.  Wear any splinting, casting, elastic bandage applications, or slings as instructed.  Only take over-the-counter or prescription medicines for pain, discomfort, or fever as directed by your caregiver. Do not use aspirin immediately after the injury unless instructed by your physician. Aspirin can cause increased bleeding and bruising of the tissues.  If you were given crutches, continue to use them as instructed and do not resume weight bearing on the sore joint until instructed. Persistent pain and inability to use the sore joint as directed for more than 2 to 3 days are warning signs indicating that you should see a caregiver for a follow-up visit as soon as possible. Initially, a hairline fracture (break in bone) may not be evident on X-rays. Persistent pain and swelling indicate that further evaluation, non-weight bearing or use of the joint (use of crutches or slings as instructed), or further X-rays are indicated. X-rays may sometimes not show a small fracture until a week or 10 days later. Make a follow-up appointment with your own caregiver or one to whom we have referred you. A radiologist (specialist in reading X-rays) may read your X-rays. Make sure you know how you are to obtain your X-ray results. Do not assume everything is normal if  you do not hear from us. SEEK MEDICAL CARE IF: Bruising, swelling, or pain increases. SEEK IMMEDIATE MEDICAL CARE IF:   Your fingers or toes are numb or blue.  The pain is not responding to medications and continues to stay the same or get worse.  The pain in your joint becomes severe.  You develop a fever over 102 F (38.9 C).  It becomes impossible to move or  use the joint. MAKE SURE YOU:   Understand these instructions.  Will watch your condition.  Will get help right away if you are not doing well or get worse. Document Released: 01/09/2005 Document Revised: 04/03/2011 Document Reviewed: 08/28/2007 [facility] Patient Information 2015 [place] [place]. This information is not intended to replace advice given to you by your health care provider. Make sure you discuss any questions you have with your health care provider.   Emergency Department Resource Guide 1) Find a Doctor and Pay Out of Pocket Although you won't have to find out who is covered by your insurance plan, it is a good idea to ask around and get recommendations. You will then need to call the office and see if the doctor you have chosen will accept you as a new patient and what types of options they offer for patients who are self-pay. Some doctors offer discounts or will set up payment plans for their patients who do not have insurance, but you will need to ask so you aren't surprised when you get to your appointment.  2) Contact Your Local Health Department Not all health departments have doctors that can see patients for sick visits, but many do, so it is worth a call to see if yours does. If you don't know where your local health department is, you can check in your phone book. The CDC also has a tool to help you locate your state's health department, and many state websites also have listings of all of their local health departments.  3) Find a Walk-in Clinic If your illness is not likely to be very severe or complicated, you may want to try a walk in clinic. These are popping up all over the country in pharmacies, drugstores, and shopping centers. They're usually staffed by nurse practitioners or physician assistants that have been trained to treat common illnesses and complaints. They're usually fairly quick and inexpensive. However, if you have serious medical issues or chronic  medical problems, these are probably not your best option.  No Primary Care Doctor: - Call Health Connect at  [phone] - they can help you locate a primary care doctor that  accepts your insurance, provides certain services, etc. - Physician Referral Service- [phone]  Chronic Pain Problems: Organization         Address  Phone   Notes  [facility]  [phone] Patients need to be referred by their primary care doctor.   Medication Assistance: Organization         Address  Phone   Notes  [facility] [address] [place], [place] [id number] [phone]) [phone] --Must be a resident of [place] [facility] [place] -- Must have NO insurance coverage whatsoever (no Medicaid/ Medicare, etc.) -- The pt. MUST have a primary care doctor that directs their care regularly and follows them in the community   [facility]  [phone]   [employer]  [phone]) [phone]    Agencies that provide inexpensive medical care: Organization  Address  Phone   Notes  °[facility]  (336) 832-8035   °[facility]    (336) 832-7272   °[facility] [address] °[place], [place] [address] (336) 832-4777   °[facility] [address], °[place] (336) 271-4999   °[facility]    (336) 373-0678   °[facility]    (336) 272-1050   °[facility] ° [address], [place] Phone:  (336) [phone], Fax:  ([phone]) [phone] Hours of Operation:  9 am - 6 pm, M-F.  Also accepts Medicaid/Medicare and self-pay.  °[facility] ° [address], [place] Phone: (336) [phone], Fax: ([phone]) [phone]. Hours of Operation:  8:30 am - 5:30 pm, M-F.  Also accepts Medicaid and self-pay.  °[facility] [place] [address], [place] Phone: (336) [phone]   °[facility] [address], [place], [place] (336)723-1848,  Ext. 123 Mondays & Thursdays: 7-9 AM.  First 15 patients are seen on a first come, first serve basis. °  ° °Medicaid-accepting [place] Providers: ° °Organization         Address  Phone   Notes  °[facility] [address], [place] ([phone]) [phone] Also accepts self-pay patients.  °[facility] [address], [place] ° (336) [phone]   °[facility] [address], [place] (336) 288-8857   °[facility] [address] [place] [address], [place] (336) 299-7000   °Veita Bland [address], [place]  ° (336) [phone] Only accepts [place] Access Medicaid patients after they have their name applied to their card.  ° °Self-Pay (no insurance) in [place]: ° °Organization         Address  Phone   Notes  °Sickle Cell Patients, [facility] [address], [place] (336) 832-1970   °[facility] [address], [place] (336) 832-4400   ° [facility] ° [address], [place] (336) [phone]   °[facility]/Dr. Osei-Bonsu ° [address] [place] [address], [place] or [address], [place] (336) [phone] Phone number for both [place] and [place] locations is the same.  °[facility] [address], [place] (336) 299-0000   °[facility] [address] [place] [address], [place] or [address] (336) 852-7530 °(336) 878-2260   °[facility] [address],  (336) 350-1642, phone; ([phone]) [phone], fax Sees patients 1st and 3rd Saturday of every month.  Must not qualify for public or private insurance (i.e. Medicaid, Medicare, [place] Health Choice, Veterans' Benefits) • Household income should be no more than 200% of the poverty level •The clinic cannot treat you if you are pregnant or think you are pregnant • Sexually transmitted diseases are not  treated at the clinic.  ° ° °Dental Care: °Organization         Address  Phone  Notes  °[place] [facility] [address],  (336) 641-6152 Accepts children up to age 21 who are enrolled in Medicaid or [place] Health Choice; pregnant women with a Medicaid card; and children who have applied for Medicaid or [place] Health Choice, but were declined, whose parents can pay a reduced fee at time   of service.  [facility]  [address], [place] [phone] Accepts children up to age 69 who are enrolled in [place] or [place]; pregnant women with a Medicaid card; and children who have applied for Medicaid or [place] Health Choice, but were declined, whose parents can pay a reduced fee at time of service.  [facility]  [place] [phone] Patients are seen by appointment only. Walk-ins are not accepted. [place] will see patients 47 years of age and older. Monday - Tuesday (8am-5pm) Most Wednesdays (8:30-5pm) $30 per visit, cash only  [facility]  [address], [facility] [phone] Patients are seen by appointment only. Walk-ins are not accepted. [place] will see patients 61 years of age and older. One Wednesday Evening (Monthly: Volunteer Based).  $30 per visit, cash only  [place]  [phone] for adults; Children under age 39, call Graduate Pediatric Dentistry at [phone]. Children aged 35-14, please call [phone] to request a pediatric application.  Dental services are provided in all areas of dental care including fillings, crowns and bridges, complete and partial dentures, implants, gum treatment, root canals, and extractions. Preventive care is also provided. Treatment is provided to both adults and children. Patients are selected via a lottery and there is  often a waiting list.   [facility] [address] [place]  [phone] [email or web address]   [facility] [address] [place], [place] [phone], Ext. 123 Second and Fourth Thursday of each month, opens at 6:30 AM; Clinic ends at 9 AM.  Patients are seen on a first-come first-served basis, and a limited number are seen during each clinic.   [facility]  [address] Hillard Danker [place], [place] [phone]   Eligibility Requirements You must have lived in [place], [place], or [place] counties for at least the last three months.   You cannot be eligible for state or federal sponsored [employer], including [employer], [place], or [employer].   You generally cannot be eligible for healthcare insurance through your employer.    How to apply: Eligibility screenings are held every Tuesday and Wednesday afternoon from 1:00 pm until 4:00 pm. You do not need an appointment for the interview!  [facility] [address] [place], [place]   [place]  [place] Department  [place]  [phone]    Behavioral Health Resources in the Community: Intensive Outpatient Programs Organization         Address  Phone  Notes  [place] [address] [place], [place] [phone]   [facility] Outpatient [address] [place], [place]   [facility] [address] [place], [place]   [place] [address],  [place], [place] or (438)[phone]   Substance Abuse Resources Organization         Address  Phone  Notes  [facility]  (914)174-7314   [place]  [phone]   [facility]   Chinita Pester  7193646751   Residential & Outpatient Substance  Abuse Program  351-052-0514   Psychological Services Organization         Address  Phone  Notes  °[facility]  336- 832-9600   °[facility]  336- 378-7881   °[facility] [address], [place] 1-800-853-5163 or 336-641-4981   ° °Mobile Crisis Teams °Organization         Address  Phone  Notes  °[facility], Mobile Crisis Care Unit  1-877-626-1772   °Assertive °[facility] ° [address] [place], [place] 336-834-9664   °Sharon DeEsch [address] °[place] 336-554-5454   ° °Self-Help/Support Groups °Organization         Address  Phone             Notes  °[facility] - variety of support groups  336- 373-1402 Call for more information  °Narcotics Anonymous (NA), [facility] [address], °[place]  2 meetings at this location  ° °Residential Treatment Programs °Organization         Address  Phone  Notes  °[facility] [address],    °[place]  1-866-801-8205   °[facility] ° [address], [place], [place] [phone]   °[facility] [address], [place] 336-845-3988 Admissions: 8am-3pm M-F  °[facility] [address]    °[place], [place] 336-841-1104   °[facility] [address], [place], [place] 336-379-7146   °[facility] [address]  °[place], [place] 336-285-9073   °[facility] - Intensive Outpatient [address], , [place] 336-852-3033   °[facility] [address]  °[place], [place] 1-877-615-2722 or 336-784-9470   °[facility] ([facility]) [address] [place], [place] 336-227-7417 Accepts Medicaid  °[facility] [address]  ° [place] 1-800-659-3381 Substance Abuse/Addiction Treatment  ° °[place] Behavioral Health Resources °Organization          Address  Phone  Notes  °[facility]  (888) 581-9988   °Julie Brannon, PhD [address] [place], [place]   (336) 349-5553 or (336) 951-0000   °[facility]   [address] °[place], [place] (336) 349-4454   °[facility] [address], [place], [place] (336) 342-8316 Insurance/Medicaid/sponsorship through [facility]  °[facility] [address]                                    [place], [place] (336) 342-8316 Therapy/tele-psych/case  °[facility] [address]  ° [place], [place] (336) 349-2233    °Dr. Arfeen  (336) 349-4544   °[facility] [place]  [employer] [place] [facility] 1) [address], [place] °2) [address], [place] °3)  [address], [place] (336) 349-3220 °(336) 342-7768 ° °(336) 342-8140   °[place] Child Abuse Hotline (336) 342-1394 or (336) 342-3537 (After Hours)    ° ° ° °

## 2014-03-05 ENCOUNTER — Encounter (HOSPITAL_COMMUNITY): Payer: Self-pay | Admitting: Emergency Medicine

## 2014-03-05 ENCOUNTER — Emergency Department (HOSPITAL_COMMUNITY)
Admission: EM | Admit: 2014-03-05 | Discharge: 2014-03-05 | Disposition: A | Discharge status: Home / Self Care | Payer: Self-pay | Attending: Emergency Medicine | Admitting: Emergency Medicine

## 2014-03-05 DIAGNOSIS — E669 Obesity, unspecified: Secondary | ICD-10-CM | POA: Insufficient documentation

## 2014-03-05 DIAGNOSIS — M170 Bilateral primary osteoarthritis of knee: Secondary | ICD-10-CM | POA: Insufficient documentation

## 2014-03-05 DIAGNOSIS — Z720 Tobacco use: Secondary | ICD-10-CM | POA: Insufficient documentation

## 2014-03-05 DIAGNOSIS — Z791 Long term (current) use of non-steroidal anti-inflammatories (NSAID): Secondary | ICD-10-CM | POA: Insufficient documentation

## 2014-03-05 DIAGNOSIS — J039 Acute tonsillitis, unspecified: Secondary | ICD-10-CM | POA: Insufficient documentation

## 2014-03-05 DIAGNOSIS — I10 Essential (primary) hypertension: Secondary | ICD-10-CM | POA: Insufficient documentation

## 2014-03-05 DIAGNOSIS — Z7952 Long term (current) use of systemic steroids: Secondary | ICD-10-CM | POA: Insufficient documentation

## 2014-03-05 DIAGNOSIS — Z792 Long term (current) use of antibiotics: Secondary | ICD-10-CM | POA: Insufficient documentation

## 2014-03-05 LAB — RAPID STREP SCREEN (MED CTR MEBANE ONLY): Streptococcus, Group A Screen (Direct): NEGATIVE

## 2014-03-05 MED ORDER — IBUPROFEN 800 MG PO TABS: 800.0000 mg | ORAL_TABLET | Freq: Three times a day (TID) | ORAL | Status: DC

## 2014-03-05 MED ORDER — DEXAMETHASONE SODIUM PHOSPHATE 10 MG/ML IJ SOLN
10.0000 mg | Freq: Once | INTRAMUSCULAR | Status: AC
  Administered 2014-03-05: 10 mg via INTRAMUSCULAR
  Filled 2014-03-05: qty 1

## 2014-03-05 MED ORDER — AMOXICILLIN 500 MG PO CAPS
500.0000 mg | ORAL_CAPSULE | Freq: Once | ORAL | Status: AC
  Administered 2014-03-05: 500 mg via ORAL
  Filled 2014-03-05: qty 1

## 2014-03-05 MED ORDER — SALINE SPRAY 0.65 % NA SOLN: 1.0000 | NASAL | Status: DC | PRN

## 2014-03-05 MED ORDER — IBUPROFEN 400 MG PO TABS
800.0000 mg | ORAL_TABLET | Freq: Once | ORAL | Status: AC
  Administered 2014-03-05: 800 mg via ORAL
  Filled 2014-03-05: qty 2

## 2014-03-05 MED ORDER — AMOXICILLIN 500 MG PO CAPS: 500.0000 mg | ORAL_CAPSULE | Freq: Two times a day (BID) | ORAL | Status: DC

## 2014-03-05 NOTE — ED Notes (Signed)
Per EMS: pt here with nasal congestion, cough, sore throat and body aches x 2 days

## 2014-03-05 NOTE — Discharge Instructions (Signed)
Tonsillitis Tonsillitis is an infection of the throat that causes the tonsils to become red, tender, and swollen. Tonsils are collections of lymphoid tissue at the back of the throat. Each tonsil has crevices (crypts). Tonsils help fight nose and throat infections and keep infection from spreading to other parts of the body for the first 18 months of life.  CAUSES Sudden (acute) tonsillitis is usually caused by infection with streptococcal bacteria. Long-lasting (chronic) tonsillitis occurs when the crypts of the tonsils become filled with pieces of food and bacteria, which makes it easy for the tonsils to become repeatedly infected. SYMPTOMS  Symptoms of tonsillitis include:  A sore throat, with possible difficulty swallowing.  White patches on the tonsils.  Fever.  Tiredness.  New episodes of snoring during sleep, when you did not snore before.  Small, foul-smelling, yellowish-white pieces of material (tonsilloliths) that you occasionally cough up or spit out. The tonsilloliths can also cause you to have bad breath. DIAGNOSIS Tonsillitis can be diagnosed through a physical exam. Diagnosis can be confirmed with the results of lab tests, including a throat culture. TREATMENT  The goals of tonsillitis treatment include the reduction of the severity and duration of symptoms and prevention of associated conditions. Symptoms of tonsillitis can be improved with the use of steroids to reduce the swelling. Tonsillitis caused by bacteria can be treated with antibiotic medicines. Usually, treatment with antibiotic medicines is started before the cause of the tonsillitis is known. However, if it is determined that the cause is not bacterial, antibiotic medicines will not treat the tonsillitis. If attacks of tonsillitis are severe and frequent, your health care provider may recommend surgery to remove the tonsils (tonsillectomy). HOME CARE INSTRUCTIONS   Rest as much as possible and get plenty of  sleep.  Drink plenty of fluids. While the throat is very sore, eat soft foods or liquids, such as sherbet, soups, or instant breakfast drinks.  Eat frozen ice pops.  Gargle with a warm or cold liquid to help soothe the throat. Mix 1/4 teaspoon of salt and 1/4 teaspoon of baking soda in 8 oz of water. SEEK MEDICAL CARE IF:   Large, tender lumps develop in your neck.  A rash develops.  A green, yellow-brown, or bloody substance is coughed up.  You are unable to swallow liquids or food for 24 hours.  You notice that only one of the tonsils is swollen. SEEK IMMEDIATE MEDICAL CARE IF:   You develop any new symptoms such as vomiting, severe headache, stiff neck, chest pain, or trouble breathing or swallowing.  You have severe throat pain along with drooling or voice changes.  You have severe pain, unrelieved with recommended medications.  You are unable to fully open the mouth.  You develop redness, swelling, or severe pain anywhere in the neck.  You have a fever. MAKE SURE YOU:   Understand these instructions.  Will watch your condition.  Will get help right away if you are not doing well or get worse. Document Released: 10/19/2004 Document Revised: 05/26/2013 Document Reviewed: 06/28/2012 [facility] Patient Information 2015 [place]. This information is not intended to replace advice given to you by your health care provider. Make sure you discuss any questions you have with your health care provider.  Upper Respiratory Infection, Adult An upper respiratory infection (URI) is also sometimes known as the common cold. The upper respiratory tract includes the nose, sinuses, throat, trachea, and bronchi. Bronchi are the airways leading to the lungs. Most people improve within 1  week, but symptoms can last up to 2 weeks. A residual cough may last even longer.  CAUSES Many different viruses can infect the tissues lining the upper respiratory tract. The tissues become irritated  and inflamed and often become very moist. Mucus production is also common. A cold is contagious. You can easily spread the virus to others by oral contact. This includes kissing, sharing a glass, coughing, or sneezing. Touching your mouth or nose and then touching a surface, which is then touched by another person, can also spread the virus. SYMPTOMS  Symptoms typically develop 1 to 3 days after you come in contact with a cold virus. Symptoms vary from person to person. They may include:  Runny nose.  Sneezing.  Nasal congestion.  Sinus irritation.  Sore throat.  Loss of voice (laryngitis).  Cough.  Fatigue.  Muscle aches.  Loss of appetite.  Headache.  Low-grade fever. DIAGNOSIS  You might diagnose your own cold based on familiar symptoms, since most people get a cold 2 to 3 times a year. Your caregiver can confirm this based on your exam. Most importantly, your caregiver can check that your symptoms are not due to another disease such as strep throat, sinusitis, pneumonia, asthma, or epiglottitis. Blood tests, throat tests, and X-rays are not necessary to diagnose a common cold, but they may sometimes be helpful in excluding other more serious diseases. Your caregiver will decide if any further tests are required. RISKS AND COMPLICATIONS  You may be at risk for a more severe case of the common cold if you smoke cigarettes, have chronic heart disease (such as heart failure) or lung disease (such as asthma), or if you have a weakened immune system. The very young and very old are also at risk for more serious infections. Bacterial sinusitis, middle ear infections, and bacterial pneumonia can complicate the common cold. The common cold can worsen asthma and chronic obstructive pulmonary disease (COPD). Sometimes, these complications can require emergency medical care and may be life-threatening. PREVENTION  The best way to protect against getting a cold is to practice good hygiene.  Avoid oral or hand contact with people with cold symptoms. Wash your hands often if contact occurs. There is no clear evidence that vitamin C, vitamin E, echinacea, or exercise reduces the chance of developing a cold. However, it is always recommended to get plenty of rest and practice good nutrition. TREATMENT  Treatment is directed at relieving symptoms. There is no cure. Antibiotics are not effective, because the infection is caused by a virus, not by bacteria. Treatment may include:  Increased fluid intake. Sports drinks offer valuable electrolytes, sugars, and fluids.  Breathing heated mist or steam (vaporizer or shower).  Eating chicken soup or other clear broths, and maintaining good nutrition.  Getting plenty of rest.  Using gargles or lozenges for comfort.  Controlling fevers with ibuprofen or acetaminophen as directed by your caregiver.  Increasing usage of your inhaler if you have asthma. Zinc gel and zinc lozenges, taken in the first 24 hours of the common cold, can shorten the duration and lessen the severity of symptoms. Pain medicines may help with fever, muscle aches, and throat pain. A variety of non-prescription medicines are available to treat congestion and runny nose. Your caregiver can make recommendations and may suggest nasal or lung inhalers for other symptoms.  HOME CARE INSTRUCTIONS   Only take over-the-counter or prescription medicines for pain, discomfort, or fever as directed by your caregiver.  Use a warm mist humidifier  or inhale steam from a shower to increase air moisture. This may keep secretions moist and make it easier to breathe.  Drink enough water and fluids to keep your urine clear or pale yellow.  Rest as needed.  Return to work when your temperature has returned to normal or as your caregiver advises. You may need to stay home longer to avoid infecting others. You can also use a face mask and careful hand washing to prevent spread of the  virus. SEEK MEDICAL CARE IF:   After the first few days, you feel you are getting worse rather than better.  You need your caregiver's advice about medicines to control symptoms.  You develop chills, worsening shortness of breath, or brown or red sputum. These may be signs of pneumonia.  You develop yellow or brown nasal discharge or pain in the face, especially when you bend forward. These may be signs of sinusitis.  You develop a fever, swollen neck glands, pain with swallowing, or white areas in the back of your throat. These may be signs of strep throat. SEEK IMMEDIATE MEDICAL CARE IF:   You have a fever.  You develop severe or persistent headache, ear pain, sinus pain, or chest pain.  You develop wheezing, a prolonged cough, cough up blood, or have a change in your usual mucus (if you have chronic lung disease).  You develop sore muscles or a stiff neck. Document Released: 07/05/2000 Document Revised: 04/03/2011 Document Reviewed: 04/16/2013 [facility] Patient Information 2015 [place], [place]. This information is not intended to replace advice given to you by your health care provider. Make sure you discuss any questions you have with your health care provider.   Emergency Department Resource Guide 1) Find a Doctor and Pay Out of Pocket Although you won't have to find out who is covered by your insurance plan, it is a good idea to ask around and get recommendations. You will then need to call the office and see if the doctor you have chosen will accept you as a new patient and what types of options they offer for patients who are self-pay. Some doctors offer discounts or will set up payment plans for their patients who do not have insurance, but you will need to ask so you aren't surprised when you get to your appointment.  2) Contact Your Local Health Department Not all health departments have doctors that can see patients for sick visits, but many do, so it is worth a call to see  if yours does. If you don't know where your local health department is, you can check in your phone book. The CDC also has a tool to help you locate your state's health department, and many state websites also have listings of all of their local health departments.  3) Find a Walk-in Clinic If your illness is not likely to be very severe or complicated, you may want to try a walk in clinic. These are popping up all over the country in pharmacies, drugstores, and shopping centers. They're usually staffed by nurse practitioners or physician assistants that have been trained to treat common illnesses and complaints. They're usually fairly quick and inexpensive. However, if you have serious medical issues or chronic medical problems, these are probably not your best option.  No Primary Care Doctor: - Call Health Connect at  [phone] - they can help you locate a primary care doctor that  accepts your insurance, provides certain services, etc. - Physician Referral Service- [phone]  Chronic Pain Problems:  Organization         Address  Phone   Notes  [facility]  [phone] Patients need to be referred by their primary care doctor.   Medication Assistance: Organization         Address  Phone   Notes  [facility] [place] Medication [facility] [address] [place] [place], [place] [address] [phone] --Must be a resident of [facility] -- Must have NO insurance coverage whatsoever (no Medicaid/ Medicare, etc.) -- The pt. MUST have a primary care doctor that directs their care regularly and follows them in the community   [facility]  [phone]   [employer]  [phone]    Agencies that provide inexpensive medical care: Organization         Address  Phone   Notes  [facility]  [phone]   [facility]    [phone]   [facility] [address] [place], [place] [address] [phone]   [facility] [address] [place] [phone]   Planned Parenthood    [phone]   [facility]    [phone]   Community Health and [facility]  [address], [place] Phone:  [phone], Fax:  [phone] Hours of Operation:  9 am - 6 pm, M-F.  Also accepts Medicaid/Medicare and self-pay.  [facility]  [address], [place] Phone: [phone], Fax: [phone]. Hours of Operation:  8:30 am - 5:30 pm, M-F.  Also accepts Medicaid and self-pay.  [facility] [place] [address] [place] Phone: [phone]   [facility] [address] Natasha Bence [place], [place] [phone], Ext. [phone] Mondays & Thursdays: 7-9 AM.  First 15 patients are seen on a first come, first serve basis.    Medicaid-accepting [place] [facility] Providers:  Organization         Address  Phone   Notes  [facility] [address], [place] [phone] Also accepts self-pay patients.  [facility] [address] Laurell Josephs [place], [place]  [phone]   [facility] [address], [place] [phone]   [facility] [address], [place] [phone]   Renaye Rakers [address], [place]   [phone] Only accepts [place] Access [place] patients after they have their name applied to their card.   Self-Pay (no insurance) in [facility]:  Organization         Address  Phone   Notes  Sickle Cell Patients, [facility] Internal Medicine [address] [place], [place] [phone]   [facility] [address] [place], [place] [phone]   [facility]  [address], [place] [phone]   [facility]/Dr. Osei-Bonsu  [address] [place] or [address], [place] [phone] Phone number for both [place] and [place] locations is the same.  Urgent Medical and [facility] [address], [place] [phone]   [facility] [address], [place] or [address] [phone]   [facility] [address], [place] [phone], phone; [phone], fax Sees patients 1st and  3rd Saturday of every month.  Must not qualify for public or private insurance (i.e. Medicaid, Medicare, [place] Health Choice, Veterans' Benefits)  Household income should be no more than 200% of the poverty level The clinic cannot treat you if you are pregnant or think you are pregnant  Sexually transmitted diseases are not treated at the clinic.    Dental Care: Organization         Address  Phone  Notes  [facility] Department of [facility] [address] [place], [place] [phone] Accepts children up to age 81 who are enrolled in [place] or [place] Health Choice; pregnant women with a Medicaid card; and children who have applied for Medicaid or [place] Health Choice, but were declined, whose parents can pay a reduced fee at time of service.  [facility] of [facility]  [address], [place] [phone] Accepts children up to age 43 who are enrolled in [place] or [place] Health Choice; pregnant women with a Medicaid card; and children who have applied for Medicaid or [place] Health Choice, but were declined, whose parents can pay a reduced fee at time of service.  [facility]  [address] [place], [place] [phone] Patients are seen by appointment only. Walk-ins are not accepted. [facility] will see patients 64 years of age and older. Monday - Tuesday (8am-5pm) Most Wednesdays (8:30-5pm) $30 per visit, cash only  [facility]  [address], [facility] [phone] Patients are  seen by appointment only. Walk-ins are not accepted. [facility] will see patients 56 years of age and older. One Wednesday Evening (Monthly: Volunteer Based).  $30 per visit, cash only  [employer]  [phone] for adults; Children under age 59, call Graduate Pediatric Dentistry at [phone]. Children aged 53-14, please call [phone] to request a pediatric application.  Dental services are provided in all areas of dental care including fillings, crowns and bridges, complete and partial dentures, implants, gum treatment, root canals, and extractions. Preventive care is also provided. Treatment is provided to both adults and children. Patients are selected via a lottery and there is often a waiting list.   [facility] [address] [place]  [phone] [email or web address]   [facility] [address] [place], [place] [phone], Ext. [phone] Second and Fourth Thursday of each month, opens at 6:30 AM; Clinic ends at 9 AM.  Patients are seen on a first-come first-served basis, and a limited number are seen during each clinic.   [facility]  [address] Ether Griffins [place], [place] [phone]   Eligibility Requirements You must have lived in [place], [place], or [place] counties for at least the last three months.   You cannot be eligible for state or federal sponsored [place], including [employer], [place], or [employer].   You generally cannot be eligible for healthcare insurance through your employer.    How to apply: Eligibility screenings are held every Tuesday and Wednesday afternoon from 1:00 pm until 4:00 pm. You do not need an appointment for the interview!  [facility] [address] [place], [place] [phone]   [facility]  [phone]   [facility] Health Department  [phone]   [facility] Health Department  [phone]     Behavioral Health Resources in the Community: Intensive Outpatient Programs Organization  Address  Phone  Notes  [facility] [address], [place], [place] [phone]   [facility] Outpatient [address] [place], [place] [phone]   [facility] [address], [place], [place]  [phone]   [facility] Mental Health [address]  [place], [place] [phone] or [phone]   Substance Abuse Resources Organization         Address  Phone  Notes  [facility]  [phone]   [facility]  [phone]   [facility]  [phone]   Floydene Flock  [phone]   Residential & Outpatient Substance Abuse Program  [phone]   Psychological Services Organization         Address  Phone  Notes  [facility] Behavioral Health  [phone]   [facility]  [phone]   [facility] Mental Health [address], [place] [phone] or [phone]    Mobile Crisis Teams Organization         Address  Phone  Notes  [facility], Mobile Crisis Care Unit  [phone]   [facility]  [address] [place], [place] [phone]   Doristine Locks [address] [place] [phone]    Self-Help/Support Groups Organization         Address  Phone             Notes  Mental Health Assoc. of  - variety of support groups  336- I7437963 Call for more information  Narcotics Anonymous (NA), [facility] [address], [employer] [place]  2 meetings at this location   Statistician         Address  Phone  Notes  [facility] [address],    [place]  [phone]   [facility]  [address] [place] [address], [place], [place] [phone]   [facility] [address] [place], [place] [phone] Admissions: 8am-3pm M-F  Incentives  [facility] Treatment Center [address]    [place], [place] [phone]   [facility] [address] [place], [place], [place] [phone]   [facility] [address]  [place], [place] [phone]   [facility] - Intensive Outpatient [address] Laurell Josephs [address], [place], [place] [phone]   [facility] [address] [place].,  [place], [place] [phone] or [phone]   [facility] ([facility]) [address] [place], [place] [phone] Accepts Medicaid  [facility] [address],  [place] [phone] Substance Abuse/Addiction Treatment   [facility] Organization         Address  Phone  Notes  [facility]  [phone]   Angie Fava, PhD [address] Ervin Knack [place], [place]   [phone] or [phone]   [facility]   [address] [place], [place] [phone]   [facility] [address], [place], [place] [phone] Insurance/Medicaid/sponsorship through [facility] and Families [address]                                    [place], [place] [phone] Therapy/tele-psych/case  [facility] [address] [place] [phone]    Dr. Lolly Mustache  [phone]   [facility]  Felt Dept. 1) [address] [place] 2) [place] 3)  [address], [place] [phone]  [phone]   [place] [phone] or [phone] (After Hours)

## 2014-03-05 NOTE — ED Provider Notes (Signed)
CSN: [id number]     Arrival date & time 03/05/14  1151 History  This chart was scribed for non-physician practitioner, Ladona Mow, PA-C, working with Elwin Mocha, MD by Milly Jakob, ED Scribe. The patient was seen in room TR07C/TR07C. Patient's care was started at 12:45 PM.    Chief Complaint  Patient presents with  . Cough  . Sore Throat   The history is provided by the patient. No language interpreter was used.   HPI Comments: April Warner is a 34 y.o. female who presents to the Emergency Department complaining of sore throat, cough, and generalized body aches which began 3 days ago. She denies checking her temperature at home, but she reports that she had a fever here in the ED. She reports associated nasal congestion, left otalgia and mild hemoptysis. She reports difficulty swallowing due to pain. She reports having a molar removed 2 weeks ago. She denies a history of asthma. She reports an allergy to Toradol.  Past Medical History  Diagnosis Date  . Hypertension   . Obesity   . Knee pain   . Arthritis     bil knees   Past Surgical History  Procedure Laterality Date  . Cholecystectomy    . Cesarean section    . Dilation and curettage of uterus N/A 09/04/2012    Procedure: DILATATION AND CURETTAGE, Hysteroscopy;  Surgeon: Tereso Newcomer, MD;  Location: [facility];  Service: Gynecology;  Laterality: N/A;   Family History  Problem Relation Age of Onset  . Diabetes Maternal Aunt   . Cancer Maternal Aunt   . Diabetes Maternal Grandmother   . Cancer Paternal Grandmother    History  Substance Use Topics  . Smoking status: Current Every Day Smoker -- 0.50 packs/day    Types: Cigarettes  . Smokeless tobacco: Not on file  . Alcohol Use: No   OB History    Gravida Para Term Preterm AB TAB SAB Ectopic Multiple Living   0 0 0 0 0 2     Review of Systems  Constitutional: Positive for chills and fatigue.  HENT: Positive for congestion and sore throat.   Respiratory:  Positive for cough.   Gastrointestinal: Negative for nausea, vomiting and diarrhea.  Musculoskeletal: Positive for arthralgias.   Allergies  Toradol  Home Medications   Prior to Admission medications   Medication Sig Start Date End Date Taking? Authorizing Provider  amoxicillin (AMOXIL) 500 MG capsule Take 1 capsule (500 mg total) by mouth 2 (two) times daily. 03/05/14   Monte Fantasia, PA-C  amoxicillin-clavulanate (AUGMENTIN) 250-62.5 MG/5ML suspension Take 10 mLs (500 mg total) by mouth 3 (three) times daily. 10/04/13   Roxy Horseman, PA-C  HYDROcodone-acetaminophen (NORCO/VICODIN) 5-325 MG per tablet Take 1 tablet by mouth every 6 (six) hours as needed for moderate pain or severe pain. 01/28/14   Mellody Drown, PA-C  ibuprofen (ADVIL,MOTRIN) 800 MG tablet Take 1 tablet (800 mg total) by mouth 3 (three) times daily. 03/05/14   Monte Fantasia, PA-C  meloxicam (MOBIC) 15 MG tablet Take 1 tablet (15 mg total) by mouth daily. 03/03/14   Hannah Muthersbaugh, PA-C  metoCLOPramide (REGLAN) 10 MG tablet Take 1 tablet (10 mg total) by mouth every 6 (six) hours as needed for nausea. 10/02/13   Dione Booze, MD  oxyCODONE-acetaminophen (PERCOCET) 5-325 MG per tablet Take 1 tablet by mouth every 4 (four) hours as needed for moderate pain. 10/02/13   Dione Booze, MD  oxyCODONE-acetaminophen (PERCOCET/ROXICET)  5-325 MG per tablet Take 1 tablet by mouth once.    Historical Provider, MD  predniSONE (DELTASONE) 20 MG tablet Take 3 tablets (60 mg total) by mouth daily. 10/02/13   Dione Boozeavid Glick, MD  sodium chloride (OCEAN) 0.65 % SOLN nasal spray Place 1 spray into both nostrils as needed for congestion. 03/05/14   Monte FantasiaJoseph W Dula Havlik, PA-C   Triage Vitals: BP 120/57 mmHg  Pulse 100  Temp(Src) 100.4 F (38 C) (Oral)  Resp 22  SpO2 100% Physical Exam  Constitutional: She is oriented to person, place, and time. She appears well-developed and well-nourished. No distress.  HENT:  Head: Normocephalic and atraumatic.   Right Ear: Tympanic membrane and external ear normal.  Left Ear: Tympanic membrane and external ear normal.  Mild erythema and edema to nasal turbinates bilaterally. Mild tonsillar exudate. No tonsillar swelling. Mild, anterior, cervical adenopathy.  Eyes: Conjunctivae and EOM are normal. Pupils are equal, round, and reactive to light.  Neck: Neck supple. No tracheal deviation present.  Cardiovascular: Normal rate, regular rhythm and normal heart sounds.   No murmur heard. Pulmonary/Chest: Effort normal and breath sounds normal. No respiratory distress. She has no wheezes. She has no rales. She exhibits no tenderness.  Abdominal: Soft. There is no tenderness.  Musculoskeletal: Normal range of motion.  Lymphadenopathy:    She has cervical adenopathy.  Neurological: She is alert and oriented to person, place, and time.  Skin: Skin is warm and dry.  Psychiatric: She has a normal mood and affect. Her behavior is normal.  Nursing note and vitals reviewed.   ED Course  Procedures (including critical care time) DIAGNOSTIC STUDIES: Oxygen Saturation is 100% on room air, normal by my interpretation.    COORDINATION OF CARE: 12:52 PM-Discussed treatment plan which includes rapid strep screen and ABX with pt at bedside and pt agreed to plan.   Labs Review Labs Reviewed  RAPID STREP SCREEN  CULTURE, GROUP A STREP   Imaging Review No results found.   EKG Interpretation None      MDM   Final diagnoses:  Tonsillitis     Pt febrile with tonsillar exudate, cervical lymphadenopathy, & dysphagia; diagnosis of strep. Treated in the Ed with steroids, NSAIDs, Pain medication and amoxicillin.  Pt appears mildly dehydrated, discussed importance of water rehydration. Presentation non concerning for PTA or infxn spread to soft tissue. No trismus or uvula deviation. Specific return precautions discussed. Pt able to drink water in ED without difficulty with intact air way. Recommended PCP follow  up. Patient verbalizes understanding and agreement of plan. I encouraged patient to call or return to the ER should she have any questions or concerns.  I personally performed the services described in this documentation, which was scribed in my presence. The recorded information has been reviewed and is accurate.  BP 123/66 mmHg  Pulse 90  Temp(Src) 100.7 F (38.2 C) (Oral)  Resp 22  SpO2 99%  Signed,  Ladona MowJoe Vu Liebman, PA-C 5:24 PM   Monte FantasiaJoseph W Duel Conrad, PA-C 03/05/14 1724  Elwin MochaBlair Walden, MD 03/06/14 [phone]

## 2014-03-06 LAB — CULTURE, GROUP A STREP

## 2015-01-27 ENCOUNTER — Encounter (HOSPITAL_COMMUNITY): Payer: Self-pay | Admitting: Emergency Medicine

## 2015-01-27 ENCOUNTER — Emergency Department (HOSPITAL_COMMUNITY)
Admission: EM | Admit: 2015-01-27 | Discharge: 2015-01-27 | Disposition: A | Discharge status: Home / Self Care | Payer: Medicaid Other | Attending: Emergency Medicine | Admitting: Emergency Medicine

## 2015-01-27 DIAGNOSIS — Z79899 Other long term (current) drug therapy: Secondary | ICD-10-CM | POA: Insufficient documentation

## 2015-01-27 DIAGNOSIS — Z792 Long term (current) use of antibiotics: Secondary | ICD-10-CM | POA: Insufficient documentation

## 2015-01-27 DIAGNOSIS — E669 Obesity, unspecified: Secondary | ICD-10-CM | POA: Insufficient documentation

## 2015-01-27 DIAGNOSIS — M1990 Unspecified osteoarthritis, unspecified site: Secondary | ICD-10-CM | POA: Insufficient documentation

## 2015-01-27 DIAGNOSIS — J069 Acute upper respiratory infection, unspecified: Secondary | ICD-10-CM | POA: Insufficient documentation

## 2015-01-27 DIAGNOSIS — I10 Essential (primary) hypertension: Secondary | ICD-10-CM | POA: Insufficient documentation

## 2015-01-27 DIAGNOSIS — B9789 Other viral agents as the cause of diseases classified elsewhere: Secondary | ICD-10-CM

## 2015-01-27 DIAGNOSIS — F17210 Nicotine dependence, cigarettes, uncomplicated: Secondary | ICD-10-CM | POA: Insufficient documentation

## 2015-01-27 MED ORDER — GUAIFENESIN 100 MG/5ML PO SOLN
5.0000 mL | Freq: Once | ORAL | Status: DC
  Filled 2015-01-27: qty 5

## 2015-01-27 MED ORDER — BENZONATATE 100 MG PO CAPS: 100.0000 mg | ORAL_CAPSULE | Freq: Three times a day (TID) | ORAL | Status: DC

## 2015-01-27 NOTE — ED Provider Notes (Signed)
CSN: [id number]     Arrival date & time 01/27/15  5784 History   First MD Initiated Contact with Patient 01/27/15 [phone]     Chief Complaint  Patient presents with  . Cough     (Consider location/radiation/quality/duration/timing/severity/associated sxs/prior Treatment) Patient is a 35 y.o. female presenting with cough. The history is provided by the patient.  Cough Cough characteristics:  Productive Sputum characteristics:  Green Severity:  Mild Onset quality:  Gradual Duration:  1 day Timing:  Constant Progression:  Worsening Chronicity:  New Smoker: no   Context: upper respiratory infection   Relieved by:  Nothing Worsened by:  Nothing tried Ineffective treatments:  None tried Associated symptoms: no fever and no shortness of breath     Past Medical History  Diagnosis Date  . Hypertension   . Obesity   . Knee pain   . Arthritis     bil knees   Past Surgical History  Procedure Laterality Date  . Cholecystectomy    . Cesarean section    . Dilation and curettage of uterus N/A 09/04/2012    Procedure: DILATATION AND CURETTAGE, Hysteroscopy;  Surgeon: Tereso Newcomer, MD;  Location: [facility];  Service: Gynecology;  Laterality: N/A;   Family History  Problem Relation Age of Onset  . Diabetes Maternal Aunt   . Cancer Maternal Aunt   . Diabetes Maternal Grandmother   . Cancer Paternal Grandmother    Social History  Substance Use Topics  . Smoking status: Current Every Day Smoker -- 0.50 packs/day    Types: Cigarettes  . Smokeless tobacco: None  . Alcohol Use: No   OB History    Gravida Para Term Preterm AB TAB SAB Ectopic Multiple Living   3 3 2 1  0 0 0 0 0 2     Review of Systems  Constitutional: Negative for fever.  Respiratory: Positive for cough. Negative for shortness of breath.   All other systems reviewed and are negative.     Allergies  Toradol  Home Medications   Prior to Admission medications   Medication Sig Start Date End Date Taking?  Authorizing Provider  amoxicillin (AMOXIL) 500 MG capsule Take 1 capsule (500 mg total) by mouth 2 (two) times daily. 03/05/14   Ladona Mow, PA-C  amoxicillin-clavulanate (AUGMENTIN) 250-62.5 MG/5ML suspension Take 10 mLs (500 mg total) by mouth 3 (three) times daily. 10/04/13   Roxy Horseman, PA-C  HYDROcodone-acetaminophen (NORCO/VICODIN) 5-325 MG per tablet Take 1 tablet by mouth every 6 (six) hours as needed for moderate pain or severe pain. 01/28/14   Mellody Drown, PA-C  ibuprofen (ADVIL,MOTRIN) 800 MG tablet Take 1 tablet (800 mg total) by mouth 3 (three) times daily. 03/05/14   Ladona Mow, PA-C  meloxicam (MOBIC) 15 MG tablet Take 1 tablet (15 mg total) by mouth daily. 03/03/14   Hannah Muthersbaugh, PA-C  metoCLOPramide (REGLAN) 10 MG tablet Take 1 tablet (10 mg total) by mouth every 6 (six) hours as needed for nausea. 10/02/13   Dione Booze, MD  oxyCODONE-acetaminophen (PERCOCET) 5-325 MG per tablet Take 1 tablet by mouth every 4 (four) hours as needed for moderate pain. 10/02/13   Dione Booze, MD  oxyCODONE-acetaminophen (PERCOCET/ROXICET) 5-325 MG per tablet Take 1 tablet by mouth once.    Historical Provider, MD  predniSONE (DELTASONE) 20 MG tablet Take 3 tablets (60 mg total) by mouth daily. 10/02/13   Dione Booze, MD  sodium chloride (OCEAN) 0.65 % SOLN nasal spray Place 1 spray into both nostrils as needed  for congestion. 03/05/14   Ladona MowJoe Mintz, PA-C   BP 126/67 mmHg  Pulse 87  Temp(Src) 97.3 F (36.3 C) (Oral)  Resp 19  SpO2 98%  LMP 11/25/2014 (Approximate) Physical Exam  Constitutional: She is oriented to person, place, and time. She appears well-developed and well-nourished. No distress.  HENT:  Head: Normocephalic.  Eyes: Conjunctivae are normal.  Neck: Neck supple. No tracheal deviation present.  Cardiovascular: Normal rate, regular rhythm and normal heart sounds.   Pulmonary/Chest: Effort normal and breath sounds normal. No respiratory distress. She has no wheezes. She has no  rales.  Abdominal: Soft. She exhibits no distension.  Neurological: She is alert and oriented to person, place, and time.  Skin: Skin is warm and dry.  Psychiatric: She has a normal mood and affect.  Vitals reviewed.   ED Course  Procedures (including critical care time) Labs Review Labs Reviewed - No data to display  Imaging Review No results found. I have personally reviewed and evaluated these images and lab results as part of my medical decision-making.   EKG Interpretation None      MDM   Final diagnoses:  Viral URI with cough    35 y.o. female presents with nasal congestion and cough since yesterday. No signs of respiratory distress, non-toxic appearing, CTAB, no concern for pneumonia with this clinical picture. No emergent testing indicated at this time. Pt discharged with likely viral cough which will be self limited in its course.     Lyndal Pulleyaniel Terri Rorrer, MD 01/27/15 [phone]

## 2015-01-27 NOTE — ED Notes (Signed)
Pt here from home with c/o flu like like symptoms , cough and general body aches , pt is a smoker

## 2015-01-27 NOTE — ED Notes (Signed)
Pt signed with faulty signature pad and than left before RN could get new signature pad

## 2015-01-27 NOTE — Discharge Instructions (Signed)
Upper Respiratory Infection, Adult Most upper respiratory infections (URIs) are a viral infection of the air passages leading to the lungs. A URI affects the nose, throat, and upper air passages. The most common type of URI is nasopharyngitis and is typically referred to as "the common cold." URIs run their course and usually go away on their own. Most of the time, a URI does not require medical attention, but sometimes a bacterial infection in the upper airways can follow a viral infection. This is called a secondary infection. Sinus and middle ear infections are common types of secondary upper respiratory infections. Bacterial pneumonia can also complicate a URI. A URI can worsen asthma and chronic obstructive pulmonary disease (COPD). Sometimes, these complications can require emergency medical care and may be life threatening.  CAUSES Almost all URIs are caused by viruses. A virus is a type of germ and can spread from one person to another.  RISKS FACTORS You may be at risk for a URI if:   You smoke.   You have chronic heart or lung disease.  You have a weakened defense (immune) system.   You are very young or very old.   You have nasal allergies or asthma.  You work in crowded or poorly ventilated areas.  You work in health care facilities or schools. SIGNS AND SYMPTOMS  Symptoms typically develop 2-3 days after you come in contact with a cold virus. Most viral URIs last 7-10 days. However, viral URIs from the influenza virus (flu virus) can last 14-18 days and are typically more severe. Symptoms may include:   Runny or stuffy (congested) nose.   Sneezing.   Cough.   Sore throat.   Headache.   Fatigue.   Fever.   Loss of appetite.   Pain in your forehead, behind your eyes, and over your cheekbones (sinus pain).  Muscle aches.  DIAGNOSIS  Your health care provider may diagnose a URI by:  Physical exam.  Tests to check that your symptoms are not due to  another condition such as:  Strep throat.  Sinusitis.  Pneumonia.  Asthma. TREATMENT  A URI goes away on its own with time. It cannot be cured with medicines, but medicines may be prescribed or recommended to relieve symptoms. Medicines may help:  Reduce your fever.  Reduce your cough.  Relieve nasal congestion. HOME CARE INSTRUCTIONS   Take medicines only as directed by your health care provider.   Gargle warm saltwater or take cough drops to comfort your throat as directed by your health care provider.  Use a warm mist humidifier or inhale steam from a shower to increase air moisture. This may make it easier to breathe.  Drink enough fluid to keep your urine clear or pale yellow.   Eat soups and other clear broths and maintain good nutrition.   Rest as needed.   Return to work when your temperature has returned to normal or as your health care provider advises. You may need to stay home longer to avoid infecting others. You can also use a face mask and careful hand washing to prevent spread of the virus.  Increase the usage of your inhaler if you have asthma.   Do not use any tobacco products, including cigarettes, chewing tobacco, or electronic cigarettes. If you need help quitting, ask your health care provider. PREVENTION  The best way to protect yourself from getting a cold is to practice good hygiene.   Avoid oral or hand contact with people with cold   symptoms.   Wash your hands often if contact occurs.  There is no clear evidence that vitamin C, vitamin E, echinacea, or exercise reduces the chance of developing a cold. However, it is always recommended to get plenty of rest, exercise, and practice good nutrition.  SEEK MEDICAL CARE IF:   You are getting worse rather than better.   Your symptoms are not controlled by medicine.   You have chills.  You have worsening shortness of breath.  You have brown or red mucus.  You have yellow or brown nasal  discharge.  You have pain in your face, especially when you bend forward.  You have a fever.  You have swollen neck glands.  You have pain while swallowing.  You have white areas in the back of your throat. SEEK IMMEDIATE MEDICAL CARE IF:   You have severe or persistent:  Headache.  Ear pain.  Sinus pain.  Chest pain.  You have chronic lung disease and any of the following:  Wheezing.  Prolonged cough.  Coughing up blood.  A change in your usual mucus.  You have a stiff neck.  You have changes in your:  Vision.  Hearing.  Thinking.  Mood. MAKE SURE YOU:   Understand these instructions.  Will watch your condition.  Will get help right away if you are not doing well or get worse.   This information is not intended to replace advice given to you by your health care provider. Make sure you discuss any questions you have with your health care provider.   Document Released: 07/05/2000 Document Revised: 05/26/2014 Document Reviewed: 04/16/2013 [employer] Interactive Patient Education 2016 [employer]  

## 2015-02-13 ENCOUNTER — Emergency Department (HOSPITAL_COMMUNITY)
Admission: EM | Admit: 2015-02-13 | Discharge: 2015-02-13 | Disposition: A | Discharge status: Home / Self Care | Payer: Medicaid Other | Attending: Emergency Medicine | Admitting: Emergency Medicine

## 2015-02-13 ENCOUNTER — Encounter (HOSPITAL_COMMUNITY): Payer: Self-pay | Admitting: *Deleted

## 2015-02-13 ENCOUNTER — Emergency Department (HOSPITAL_COMMUNITY): Payer: Medicaid Other

## 2015-02-13 DIAGNOSIS — H109 Unspecified conjunctivitis: Secondary | ICD-10-CM | POA: Insufficient documentation

## 2015-02-13 DIAGNOSIS — Z79899 Other long term (current) drug therapy: Secondary | ICD-10-CM | POA: Insufficient documentation

## 2015-02-13 DIAGNOSIS — M545 Low back pain: Secondary | ICD-10-CM

## 2015-02-13 DIAGNOSIS — I10 Essential (primary) hypertension: Secondary | ICD-10-CM | POA: Insufficient documentation

## 2015-02-13 DIAGNOSIS — E669 Obesity, unspecified: Secondary | ICD-10-CM | POA: Insufficient documentation

## 2015-02-13 DIAGNOSIS — F17210 Nicotine dependence, cigarettes, uncomplicated: Secondary | ICD-10-CM | POA: Insufficient documentation

## 2015-02-13 DIAGNOSIS — M5440 Lumbago with sciatica, unspecified side: Secondary | ICD-10-CM | POA: Insufficient documentation

## 2015-02-13 DIAGNOSIS — M1990 Unspecified osteoarthritis, unspecified site: Secondary | ICD-10-CM | POA: Insufficient documentation

## 2015-02-13 MED ORDER — TETRACAINE HCL 0.5 % OP SOLN
2.0000 [drp] | Freq: Once | OPHTHALMIC | Status: AC
  Administered 2015-02-13: 2 [drp] via OPHTHALMIC
  Filled 2015-02-13: qty 2

## 2015-02-13 MED ORDER — FLUORESCEIN SODIUM 1 MG OP STRP: 1.0000 | ORAL_STRIP | Freq: Once | OPHTHALMIC | Status: DC

## 2015-02-13 MED ORDER — TETRACAINE HCL 0.5 % OP SOLN
1.0000 [drp] | Freq: Once | OPHTHALMIC | Status: DC
Start: 2015-02-13 — End: 2015-02-13
  Administered 2015-02-13: 1 [drp] via OPHTHALMIC

## 2015-02-13 MED ORDER — OXYCODONE-ACETAMINOPHEN 5-325 MG PO TABS
1.0000 | ORAL_TABLET | Freq: Once | ORAL | Status: AC
Start: 2015-02-13 — End: 2015-02-13
  Administered 2015-02-13: 1 via ORAL
  Filled 2015-02-13: qty 1

## 2015-02-13 MED ORDER — CYCLOBENZAPRINE HCL 10 MG PO TABS: 10.0000 mg | ORAL_TABLET | Freq: Two times a day (BID) | ORAL | Status: DC | PRN

## 2015-02-13 MED ORDER — OXYCODONE-ACETAMINOPHEN 5-325 MG PO TABS: 1.0000 | ORAL_TABLET | ORAL | Status: DC | PRN

## 2015-02-13 MED ORDER — ERYTHROMYCIN 5 MG/GM OP OINT: TOPICAL_OINTMENT | OPHTHALMIC | Status: DC

## 2015-02-13 MED ORDER — FLUORESCEIN SODIUM 1 MG OP STRP
1.0000 | ORAL_STRIP | Freq: Once | OPHTHALMIC | Status: AC
  Administered 2015-02-13: 1 via OPHTHALMIC
  Filled 2015-02-13: qty 1

## 2015-02-13 MED ORDER — TETRACAINE HCL 0.5 % OP SOLN: 1.0000 [drp] | Freq: Once | OPHTHALMIC | Status: DC

## 2015-02-13 MED ORDER — FLUORESCEIN SODIUM 1 MG OP STRP
1.0000 | ORAL_STRIP | Freq: Once | OPHTHALMIC | Status: DC
  Administered 2015-02-13: 1 via OPHTHALMIC

## 2015-02-13 MED ORDER — IBUPROFEN 800 MG PO TABS: 800.0000 mg | ORAL_TABLET | Freq: Three times a day (TID) | ORAL | Status: DC

## 2015-02-13 NOTE — Discharge Instructions (Signed)
Take your medications as prescribed. I also recommend applying ice to affected area for 15-20 minutes 3-4 times daily for pain relief. Please follow up with a primary care provider from the Resource Guide provided below in 3-4 days. Please return to the Emergency Department if symptoms worsen or new onset of fever, visual changes, loss of vision, numbness, tingling, saddle anesthesia, loss of bowel or bladder, weakness.    Emergency Department Resource Guide 1) Find a Doctor and Pay Out of Pocket Although you won't have to find out who is covered by your insurance plan, it is a good idea to ask around and get recommendations. You will then need to call the office and see if the doctor you have chosen will accept you as a new patient and what types of options they offer for patients who are self-pay. Some doctors offer discounts or will set up payment plans for their patients who do not have insurance, but you will need to ask so you aren't surprised when you get to your appointment.  2) Contact Your Local Health Department Not all health departments have doctors that can see patients for sick visits, but many do, so it is worth a call to see if yours does. If you don't know where your local health department is, you can check in your phone book. The CDC also has a tool to help you locate your state's health department, and many state websites also have listings of all of their local health departments.  3) Find a Walk-in Clinic If your illness is not likely to be very severe or complicated, you may want to try a walk in clinic. These are popping up all over the country in pharmacies, drugstores, and shopping centers. They're usually staffed by nurse practitioners or physician assistants that have been trained to treat common illnesses and complaints. They're usually fairly quick and inexpensive. However, if you have serious medical issues or chronic medical problems, these are probably not your best  option.  No Primary Care Doctor: - Call Health Connect at  [phone] - they can help you locate a primary care doctor that  accepts your insurance, provides certain services, etc. - Physician Referral Service- [phone]  Chronic Pain Problems: Organization         Address  Phone   Notes  [facility]  [phone] Patients need to be referred by their primary care doctor.   Medication Assistance: Organization         Address  Phone   Notes  [facility] Medication [facility] [address] [place] [place], [place] [address] [phone] --Must be a resident of [facility] -- Must have NO insurance coverage whatsoever (no Medicaid/ Medicare, etc.) -- The pt. MUST have a primary care doctor that directs their care regularly and follows them in the community   Med[facility]ist  [phone]   [employer]  [phone]    Agencies that provide inexpensive medical care: Organization         Address  Phone   Notes  [facility]  [phone]   [facility]    [phone]   [facility] [address] [place], [place] [address] [phone]   [facility] [address] [place], [place] [phone]   Planned Parenthood    [phone]   [facility]    [phone]   Community Health and [facility]  [address], [place] Phone:  [phone], Fax:  [phone] Hours of Operation:  9 am - 6 pm, M-F.  Also accepts Medicaid/Medicare and self-pay.  [facility] [place], [address], [place] Phone: [phone], Fax: [phone]. Hours of Operation:  8:30 am - 5:30 pm, M-F.  Also accepts Medicaid and self-pay.  [facility] [place] [address], [place] Phone: [phone]   [place], [place], [place] [phone], Ext. [phone] Mondays & Thursdays: 7-9 AM.  First 15  patients are seen on a first come, first serve basis.    [place] Providers:  Organization         Address  Phone   Notes  [facility] [address], [place] [phone] Also accepts self-pay patients.  [facility] [address], [place]  [phone]   [place], [address], [place] [phone]   [facility] [address] [place] [phone]   Lucianne Lei [address], [place]   [phone] Only accepts [place] Access [place] patients after they have their name applied to their card.   Self-Pay (no insurance) in [facility]:  Organization         Address  Phone   Notes  Sickle Cell Patients, [facility] [place] [phone]   [facility] [place] [phone]   [facility] [place]  [place], [place], [place] [phone]   [facility]/Dr. Osei-Bonsu  [address], [place] [address], [place] [phone] Phone number for both [place] and [place] locations is the same.  Urgent Medical and [facility] [address] [place] [phone]   [facility] [address], [place] or [address] [phone]   [facility] [address] [place] [phone], phone; [phone], fax Sees patients 1st and 3rd Saturday of every month.  Must not qualify for public or private insurance (i.e. Medicaid, Medicare, [place] Health Choice, Veterans' Benefits)  Household income should be no more than 200% of the poverty level The clinic cannot treat you if you are pregnant or think you are pregnant  Sexually transmitted diseases are not treated at the clinic.    Dental  Care: Organization         Address  Phone  Notes  [facility] Department of [facility] [place] [phone] Accepts children up to age 61 who are enrolled in [place] or [employer]; pregnant women with a Medicaid card; and children who have applied for Medicaid or [place] Health Choice, but were declined, whose parents can pay a reduced fee at time of service.  [facility] of [facility]  [address], [place] [phone] Accepts children up to age 47 who are enrolled in [place] or [place]; pregnant women with a Medicaid card; and children who have applied for Medicaid or [place] Health Choice, but were declined, whose parents can pay a reduced fee at time of service.  [facility] Adult Dental Access PROGRAM  [place], [place] [phone]  Patients are seen by appointment only. Walk-ins are not accepted. [place] will see patients 1 years of age and older. Monday - Tuesday (8am-5pm) Most Wednesdays (8:30-5pm) $30 per visit, cash only  [facility] Adult Dental Access PROGRAM  [address], [facility] [phone] Patients are seen by appointment only. Walk-ins are not accepted. [place] will see patients 28 years of age and older. One Wednesday Evening (Monthly: Volunteer Based).  $30 per visit, cash only  [place]  [phone] for adults; Children under age 81, call Graduate Pediatric Dentistry at [phone]. Children aged 82-14, please call [phone] to request a pediatric application.  Dental services are provided in all areas of dental care including fillings, crowns and bridges, complete and partial dentures, implants, gum treatment, root canals, and extractions. Preventive care is also provided. Treatment is provided to both adults and children. Patients are selected via a lottery and there is often a waiting list.   [facility] [address] [place]  [phone] [email or web address]   [facility] [address] [place], [place] [phone], Ext. [phone] Second and Fourth Thursday of each month, opens at 6:30 AM; Clinic ends at 9 AM.  Patients are seen on a first-come first-served basis, and a limited number are seen during each clinic.   [facility]  [address] Hillard Danker [place], [place] [phone]   Eligibility Requirements You must have lived in [place], [place], or [place] counties for at least the last three months.   You cannot be eligible for state or federal sponsored [employer], including [employer], [place], or [employer].   You generally cannot be eligible for healthcare insurance through your employer.    How to apply: Eligibility screenings are held every Tuesday and Wednesday afternoon from 1:00 pm until 4:00 pm. You do not need an appointment for the interview!  [facility] [address] [place], [place]   [place]  [place] Department  [place]  [phone]    Behavioral Health Resources in the Community: Intensive Outpatient Programs Organization         Address  Phone  Notes  [place]. [address], [place], [place] [phone]   [facility] Outpatient [address] [place], [place]   ADS: Alcohol & Drug Svcs [address], [place], [place]   [place] [address],  [place], [place] or [phone]   Substance Abuse Resources Organization         Address  Phone  Notes  Alcohol and Drug Services  [phone]   [place]  [phone]   The [place]   Chinita Pester  [phone]   Residential & Outpatient Substance Abuse Program  [phone]    Psychological Services Organization         Address  Phone  Notes  [facility] [place]  [place]  [phone]   [place] [address], [place] or [phone]    Mobile Crisis Teams Organization         Address  Phone  Notes  [facility], Mobile Crisis Care Unit  [phone]   [facility]  [address] [employer], [place]   [facility] [address] [place]  [place] [phone]    Self-Help/Support Groups Organization         Address  Phone             Notes  [facility] of [place] - variety of support groups  [place] Call for more information  Narcotics Anonymous (NA), [facility] [address], [employer] [place]  2 meetings at this location   Special educational needs teacher         Address  Phone  Notes  [facility] Residential Treatment [place],    [place]  [phone]   [facility]  [address] [place] [phone], [place], [place]   [place], [place] [phone] Admissions: 8am-3pm M-F  Incentives Substance [place] [address],    [place], [place] [phone]   [facility] [address] [place], [place], [place]   [facility] [address],  [place], [place]   [facility] Programs - Intensive Outpatient [address] Kristeen [address], [place], [place]   [facility] [place].,  [place], [place] [phone] or [phone]   [facility] ([facility]) [address] [place], [place] Accepts Medicaid  [facility] [place] [address],  [place] [phone] Substance Abuse/Addiction Treatment   [facility] Organization         Address  Phone  Notes  [facility]  [phone]   Domenic Schwab, PhD [address] [place]lis [place], [place]   [phone] or [phone]   [place], [place] [phone]   [facility] [address], [place], [place] [phone] Insurance/Medicaid/sponsorship through [facility] and Families [address], [address] [place]                                    [employer], [place] [phone] [place] [address], [place] [phone]    Dr. Adele Schilder  [phone]   [facility] [place] Dept. 1) [address], [place] 2) [place] 3)  [place] [address], [place] [phone]  [phone]   [place] [phone] or [phone] (After Hours)

## 2015-02-13 NOTE — ED Notes (Signed)
Declined W/C at D/C and was escorted to lobby by RN. 

## 2015-02-13 NOTE — ED Provider Notes (Signed)
CSN: [id number]     Arrival date & time 02/13/15  4098 History  By signing my name below, I, Linus Galas, attest that this documentation has been prepared under the direction and in the presence of Barrett Henle, [place]. Electronically Signed: Linus Galas, ED Scribe. 02/13/2015. 11:14 AM.    Chief Complaint  Patient presents with  . Eye Problem  . Back Pain   The history is provided by the patient. No language interpreter was used.   HPI Comments: April Warner is a 35 y.o. female who presents to the Emergency Department complaining of constant, gradually worsened left eye pain with associated eye tearing, itching, redness, swelling, and photophobia that began 2 days ago. She reports having irritation while using her cell phone in bed and woke up the next morning with these symptoms. Patient has taken Tylenol PM and oxycodone (presribed for back pain) with mild relief. Pt denies any recent trauma or eye injuries. Pt does not wear contacts. Pt denies any exposure to people with conjunctivitis. She denies purulent drainage, blurry or double vision, fever.   Pt also complains of intermittent, sharp lower back pain with radiation down BLE for the past 3 days. He reports pain is consistent with her chronic back pain but notes it is worse. Her pain is exacerbated with standing and alleviated with oxycodone. Pt denies any recently falls or injuries. Pt denies any recent spinal manipulation, history of IV drug use, or history of CA. Pt denies any dysuria, bowel/bladder incontinence, abdominal pain, numbness or paresthesia in her BLE or groin.  Past Medical History  Diagnosis Date  . Hypertension   . Obesity   . Knee pain   . Arthritis     bil knees   Past Surgical History  Procedure Laterality Date  . Cholecystectomy    . Cesarean section    . Dilation and curettage of uterus N/A 09/04/2012    Procedure: DILATATION AND CURETTAGE, Hysteroscopy;  Surgeon: Tereso Newcomer, MD;   Location: [facility];  Service: Gynecology;  Laterality: N/A;   Family History  Problem Relation Age of Onset  . Diabetes Maternal Aunt   . Cancer Maternal Aunt   . Diabetes Maternal Grandmother   . Cancer Paternal Grandmother    Social History  Substance Use Topics  . Smoking status: Current Every Day Smoker -- 0.50 packs/day    Types: Cigarettes  . Smokeless tobacco: None  . Alcohol Use: No   OB History    Gravida Para Term Preterm AB TAB SAB Ectopic Multiple Living   0 0 0 0 0 2     Review of Systems  Constitutional: Negative for fever.  Eyes: Positive for photophobia, pain, discharge (non-purulent), redness and itching. Negative for visual disturbance.  Gastrointestinal: Negative for abdominal pain.  Genitourinary: Negative for dysuria.  Musculoskeletal: Positive for back pain.  Neurological: Negative for weakness and numbness.      Allergies  Toradol  Home Medications   Prior to Admission medications   Medication Sig Start Date End Date Taking? Authorizing Provider  amoxicillin (AMOXIL) 500 MG capsule Take 1 capsule (500 mg total) by mouth 2 (two) times daily. 03/05/14   Ladona Mow, PA-C  amoxicillin-clavulanate (AUGMENTIN) 250-62.5 MG/5ML suspension Take 10 mLs (500 mg total) by mouth 3 (three) times daily. 10/04/13   Roxy Horseman, PA-C  benzonatate (TESSALON) 100 MG capsule Take 1 capsule (100 mg total) by mouth every 8 (eight) hours. 01/27/15   Lyndal Pulley, MD  cyclobenzaprine (FLEXERIL) 10 MG tablet Take 1 tablet (10 mg total) by mouth 2 (two) times daily as needed for muscle spasms. 02/13/15   Barrett Henle, PA-C  erythromycin ophthalmic ointment Place a 1/2 inch ribbon of ointment into the lower eyelid three times daily. 02/13/15   Barrett Henle, PA-C  HYDROcodone-acetaminophen (NORCO/VICODIN) 5-325 MG per tablet Take 1 tablet by mouth every 6 (six) hours as needed for moderate pain or severe pain. 01/28/14   Mellody Drown, PA-C  ibuprofen  (ADVIL,MOTRIN) 800 MG tablet Take 1 tablet (800 mg total) by mouth 3 (three) times daily. 02/13/15   Barrett Henle, PA-C  meloxicam (MOBIC) 15 MG tablet Take 1 tablet (15 mg total) by mouth daily. 03/03/14   Hannah Muthersbaugh, PA-C  metoCLOPramide (REGLAN) 10 MG tablet Take 1 tablet (10 mg total) by mouth every 6 (six) hours as needed for nausea. 10/02/13   Dione Booze, MD  oxyCODONE-acetaminophen (PERCOCET/ROXICET) 5-325 MG tablet Take 1-2 tablets by mouth every 4 (four) hours as needed for severe pain. 02/13/15   Barrett Henle, PA-C  predniSONE (DELTASONE) 20 MG tablet Take 3 tablets (60 mg total) by mouth daily. 10/02/13   Dione Booze, MD  sodium chloride (OCEAN) 0.65 % SOLN nasal spray Place 1 spray into both nostrils as needed for congestion. 03/05/14   Ladona Mow, PA-C   BP 163/89 mmHg  Pulse 64  Temp(Src) 97.8 F (36.6 C) (Oral)  Resp 18  SpO2 100%  LMP 02/07/2015 Physical Exam  Constitutional: She is oriented to person, place, and time. She appears well-developed and well-nourished.  HENT:  Head: Normocephalic and atraumatic.  Eyes: EOM are normal. Pupils are equal, round, and reactive to light. Lids are everted and swept, no foreign bodies found. Left eye exhibits discharge (watery). Left eye exhibits no chemosis, no exudate and no hordeolum. No foreign body present in the left eye. Left conjunctiva is injected.  Slit lamp exam:      The left eye shows no corneal abrasion, no corneal flare, no corneal ulcer, no foreign body and no fluorescein uptake.  Neck: Normal range of motion. Neck supple.  Cardiovascular: Normal rate, regular rhythm, normal heart sounds and intact distal pulses.   Pulmonary/Chest: Effort normal and breath sounds normal. No respiratory distress. She has no wheezes. She has no rales. She exhibits no tenderness.  Abdominal: Soft. She exhibits no distension. There is no tenderness.  Musculoskeletal: Normal range of motion. She exhibits tenderness. She  exhibits no edema.  Midline lumbar tenderness. No cervical or thoracic tenderness. Full ROM of back and BLE. 5/5 strength of BLE, sensation intact, 2+ PT pulses. Negative straight leg raise. Patient able to stand and ambulate with out assitance, no ataxia noted.    Neurological: She is alert and oriented to person, place, and time. She has normal reflexes. She displays normal reflexes. Coordination normal.  Skin: Skin is warm and dry.  Psychiatric: She has a normal mood and affect.  Nursing note and vitals reviewed.   ED Course  Procedures  DIAGNOSTIC STUDIES: Oxygen Saturation is 99% on RA, nl by my interpretation.    COORDINATION OF CARE: 9:55 AM-Discussed treatment plan which checking for a corneal abrasion and back x-ray with pt at bedside and pt agreed to plan.   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 This is increased slightly in the interval from the prior exam. Vertebral body height is well maintained. No other focal abnormality is seen. No soft tissue changes are noted. IMPRESSION: Slight increase in the degree of grade 2 anterolisthesis of L5 on S1. Electronically Signed   By: Alcide Clever M.D.   On: 02/13/2015 11:08     Filed Vitals:   02/13/15 0937 02/13/15 1148  BP: 160/93 163/89  Pulse: 77 64  Temp: 97.9 F (36.6 C) 97.8 F (36.6 C)  Resp: 18 18      MDM   Final diagnoses:  Bilateral low back pain, with sciatica presence unspecified  Conjunctivitis of left eye    Patient reports left eye pain, itching, redness and discharge. Denies contact use. She also endorses having worsening of her chronic lower back pain, denies any recent fall, trauma, injury. Denies any back pain red flags.  VSS. Exam revealed left eye with watery discharge and injected conjunctiva, no fluroscein uptake, no evidence of foreign body, corneal abrasion/ulcer. Symptoms consistent with conjunctivitis. Mild tenderness over lumbar midline spine, bilateral lower extremities neurovascularly intact. Patient able stand and ambulate without assistance. Lumbar spine x-ray revealed slight increase in the degree of grade 2 anterior listhesis of L5 on S1. No evidence of epidural abscess, spinal cord compression/cauda equina syndrome at this time, do not feel any further workup or imaging is warranted at this time and will plan to have patient follow up with her orthopedist regarding her chronic back pain. Plan to discharge patient home with erythromycin eye ointment for suspected bacterial conjunctivitis. Plan to discharge patient home with pain meds and muscle relaxants for back pain, discussed symptomatic treatment. Advised patient to follow up with her PCP.  Evaluation does not show pathology requring ongoing emergent intervention or admission. Pt is hemodynamically stable and mentating appropriately. Discussed findings/results and plan with patient/guardian, who agrees with plan. All questions answered. Return precautions discussed and outpatient follow up given.    I personally performed the services described in this documentation, which was scribed in my presence. The recorded information has been reviewed and is accurate.    Satira Sark [place], [place] 02/14/15 1128  Doug Sou, MD 02/14/15 [phone]

## 2015-02-13 NOTE — ED Notes (Signed)
PT reports pain in LT eye for 3 days and back pain that shoots down Buttocks to bil. Leg.

## 2015-02-13 NOTE — ED Notes (Signed)
Pt placed into gown 

## 2015-08-22 ENCOUNTER — Emergency Department (HOSPITAL_COMMUNITY)
Admission: EM | Admit: 2015-08-22 | Discharge: 2015-08-22 | Disposition: A | Discharge status: Home / Self Care | Payer: Medicaid Other | Attending: Emergency Medicine | Admitting: Emergency Medicine

## 2015-08-22 ENCOUNTER — Encounter (HOSPITAL_COMMUNITY): Payer: Self-pay | Admitting: Emergency Medicine

## 2015-08-22 DIAGNOSIS — F17210 Nicotine dependence, cigarettes, uncomplicated: Secondary | ICD-10-CM | POA: Insufficient documentation

## 2015-08-22 DIAGNOSIS — Z5321 Procedure and treatment not carried out due to patient leaving prior to being seen by health care provider: Secondary | ICD-10-CM | POA: Insufficient documentation

## 2015-08-22 DIAGNOSIS — J029 Acute pharyngitis, unspecified: Secondary | ICD-10-CM | POA: Insufficient documentation

## 2015-08-22 DIAGNOSIS — I10 Essential (primary) hypertension: Secondary | ICD-10-CM | POA: Insufficient documentation

## 2015-08-22 DIAGNOSIS — H9201 Otalgia, right ear: Secondary | ICD-10-CM | POA: Insufficient documentation

## 2015-08-22 LAB — RAPID STREP SCREEN (MED CTR MEBANE ONLY): STREPTOCOCCUS, GROUP A SCREEN (DIRECT): NEGATIVE

## 2015-08-22 NOTE — ED Provider Notes (Signed)
Patient called for room assignment, no longer wanting to be seen so she decided to leave.  I did not see or evaluate patient.   Garlon Hatchet, PA-C 08/22/15 1696    Layla Maw Ward, DO 08/22/15 [phone]

## 2015-08-22 NOTE — ED Triage Notes (Signed)
Pt. reports sore throat and right ear ache onset this week . Denies fever or chills , airway intact /respirations unlabored .

## 2015-08-22 NOTE — ED Notes (Signed)
Pt not wanting to be seen in ED. Will notify MD.

## 2015-08-24 LAB — CULTURE, GROUP A STREP (THRC)

## 2016-05-23 IMAGING — CR DG KNEE COMPLETE 4+V*R*
5 series · 5 of 5 positions shown · non-contrast
Comparison: Right knee radiographs performed 02/07/2011

CLINICAL DATA: Twisting injury to right knee. Worsening right knee
pain.

EXAM:
RIGHT KNEE - COMPLETE 4+ VIEW

[x knee ap right (1 of 4)]
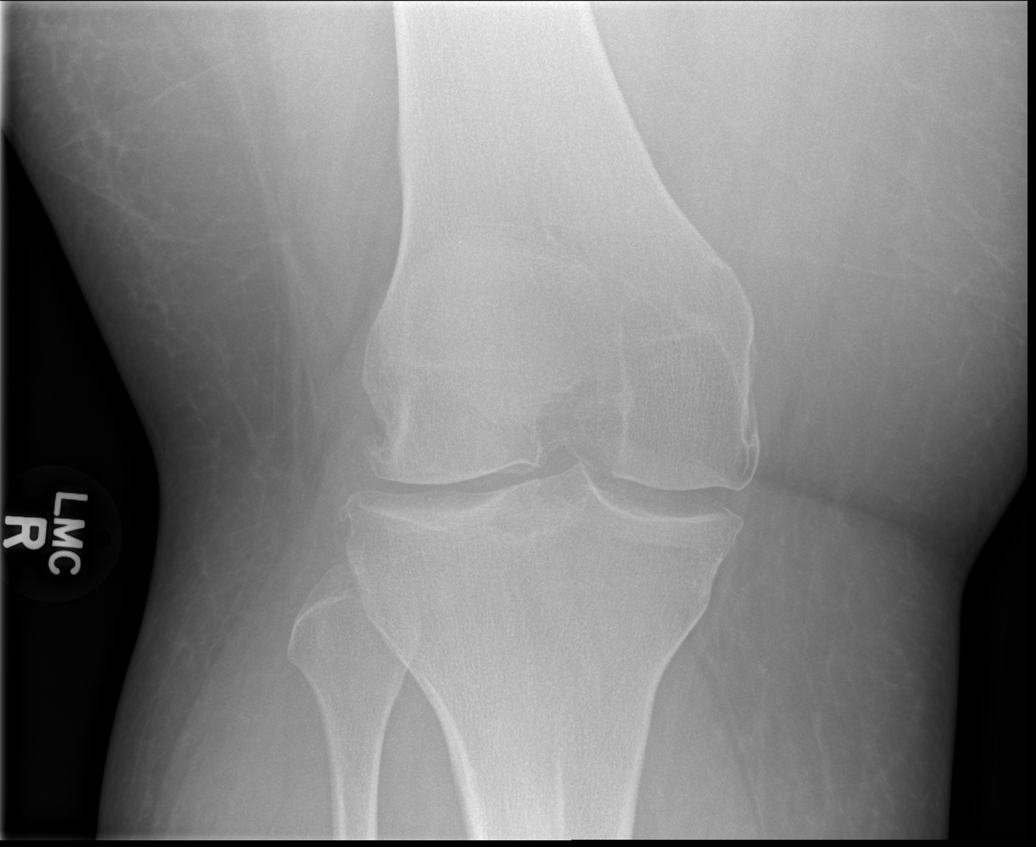

[x knee ap right (2 of 4)]
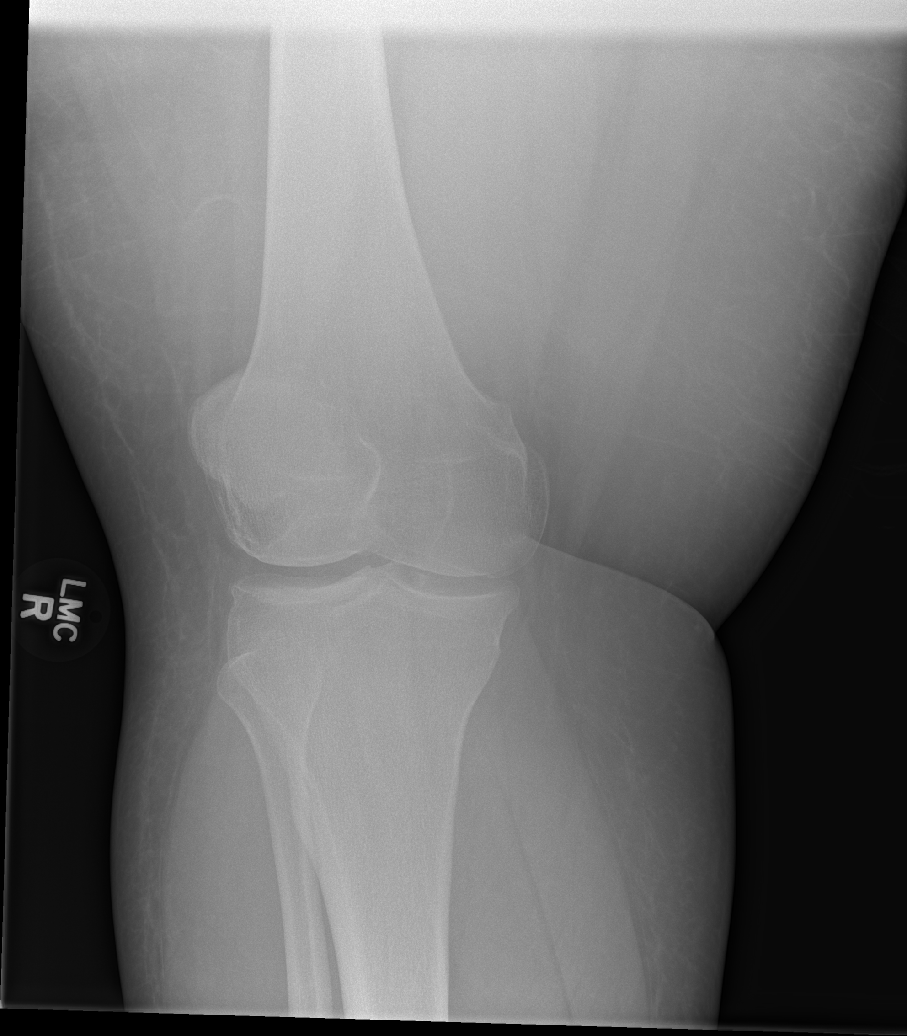

[x knee ap right (3 of 4)]
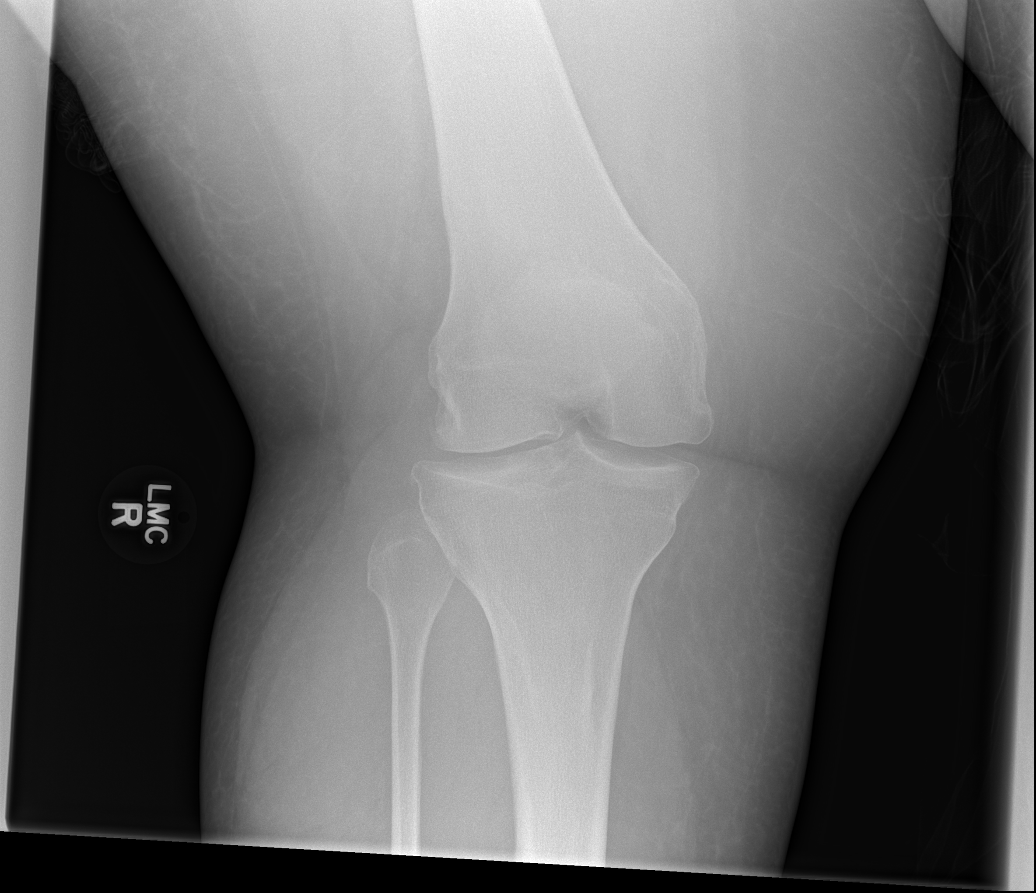

[x knee ap right (4 of 4)]
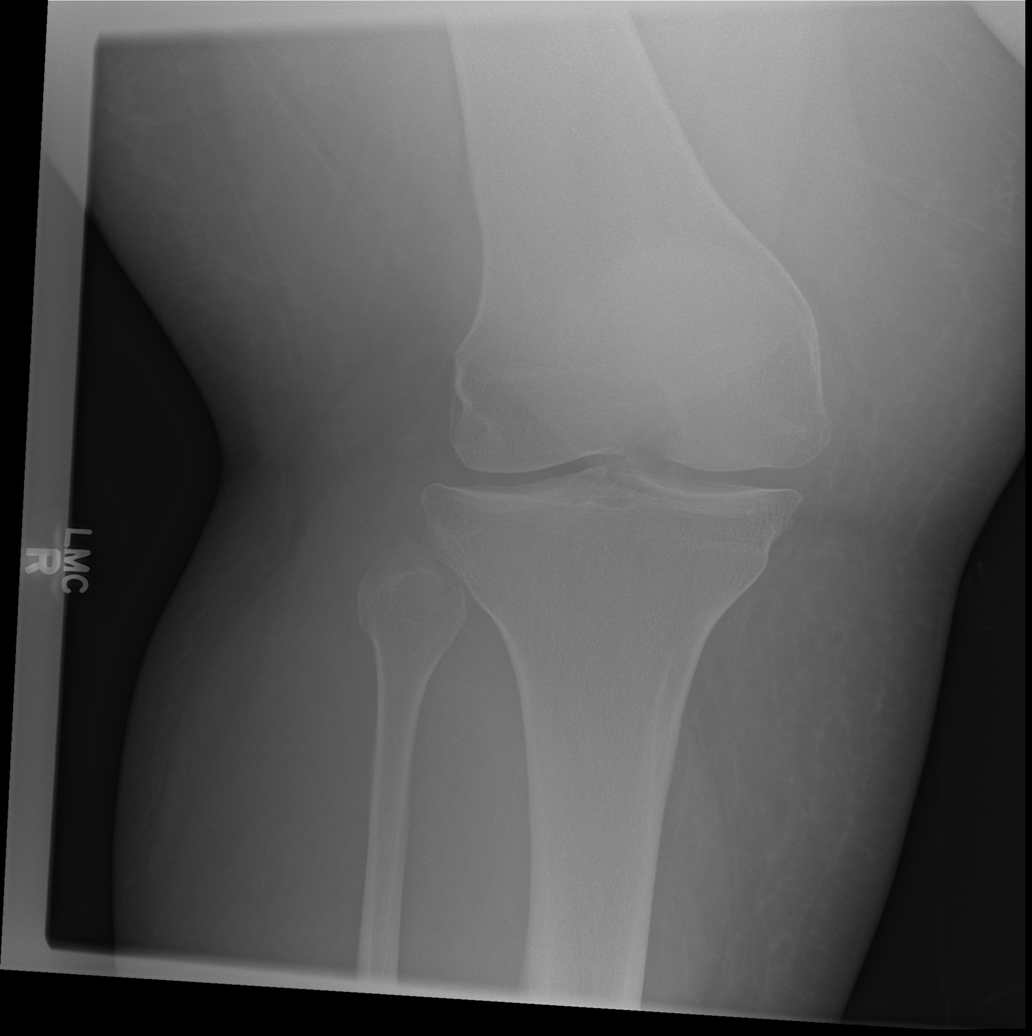

[x knee lat right]
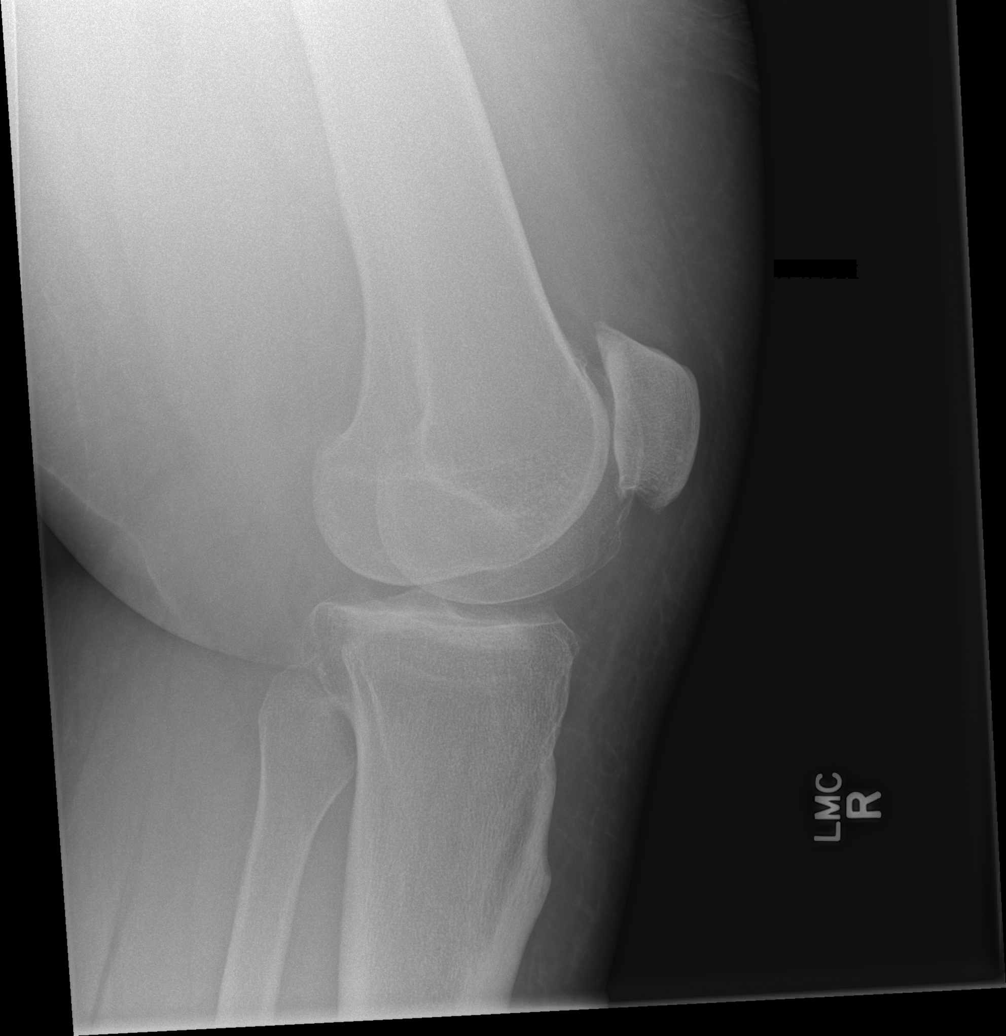

[5 of 5 positions shown; findings below may reference images not displayed]

FINDINGS: There is no evidence of fracture or dislocation. The joint spaces
are preserved. Mild marginal osteophytes are noted arising at all
three compartments.

No significant joint effusion is seen. The visualized soft tissues
are normal in appearance.
IMPRESSION: 1. No evidence of fracture or dislocation.
2. Mild degenerative osteoarthritis noted at the right knee.

## 2017-05-30 ENCOUNTER — Other Ambulatory Visit: Payer: Self-pay

## 2017-05-30 ENCOUNTER — Encounter (HOSPITAL_COMMUNITY): Payer: Self-pay

## 2017-05-30 ENCOUNTER — Emergency Department (HOSPITAL_COMMUNITY)
Admission: EM | Admit: 2017-05-30 | Discharge: 2017-05-30 | Disposition: A | Discharge status: Home / Self Care | Payer: Self-pay | Attending: Emergency Medicine | Admitting: Emergency Medicine

## 2017-05-30 DIAGNOSIS — M549 Dorsalgia, unspecified: Secondary | ICD-10-CM | POA: Insufficient documentation

## 2017-05-30 DIAGNOSIS — Z5321 Procedure and treatment not carried out due to patient leaving prior to being seen by health care provider: Secondary | ICD-10-CM | POA: Insufficient documentation

## 2017-05-30 NOTE — ED Triage Notes (Signed)
Patient complains of sciatica back pain worsening x 3 weeks. denies trauma but hx of same. Pain radiates down left buttock to leg. Ambulatory to triage

## 2017-05-30 NOTE — ED Notes (Signed)
Called patient x 2 in all areas of lobby with no response.

## 2017-10-07 ENCOUNTER — Emergency Department (HOSPITAL_COMMUNITY): Payer: Medicaid Other

## 2017-10-07 ENCOUNTER — Emergency Department (HOSPITAL_COMMUNITY)
Admission: EM | Admit: 2017-10-07 | Discharge: 2017-10-07 | Disposition: A | Discharge status: Home / Self Care | Payer: Medicaid Other | Attending: Emergency Medicine | Admitting: Emergency Medicine

## 2017-10-07 ENCOUNTER — Other Ambulatory Visit: Payer: Self-pay

## 2017-10-07 DIAGNOSIS — M25562 Pain in left knee: Secondary | ICD-10-CM | POA: Insufficient documentation

## 2017-10-07 DIAGNOSIS — R600 Localized edema: Secondary | ICD-10-CM | POA: Insufficient documentation

## 2017-10-07 DIAGNOSIS — I10 Essential (primary) hypertension: Secondary | ICD-10-CM | POA: Insufficient documentation

## 2017-10-07 DIAGNOSIS — F17210 Nicotine dependence, cigarettes, uncomplicated: Secondary | ICD-10-CM | POA: Insufficient documentation

## 2017-10-07 LAB — CBC
HEMATOCRIT: 34.4 % — AB (ref 36.0–46.0)
Hemoglobin: 10.8 g/dL — ABNORMAL LOW (ref 12.0–15.0)
MCH: 24.2 pg — ABNORMAL LOW (ref 26.0–34.0)
MCHC: 31.4 g/dL (ref 30.0–36.0)
MCV: 77.1 fL — ABNORMAL LOW (ref 78.0–100.0)
Platelets: 197 10*3/uL (ref 150–400)
RBC: 4.46 MIL/uL (ref 3.87–5.11)
RDW: 17.3 % — AB (ref 11.5–15.5)
WBC: 8.3 10*3/uL (ref 4.0–10.5)

## 2017-10-07 LAB — BASIC METABOLIC PANEL
ANION GAP: 8 (ref 5–15)
BUN: 8 mg/dL (ref 6–20)
CALCIUM: 8.5 mg/dL — AB (ref 8.9–10.3)
CO2: 24 mmol/L (ref 22–32)
Chloride: 107 mmol/L (ref 98–111)
Creatinine, Ser: 0.71 mg/dL (ref 0.44–1.00)
GFR calc Af Amer: >60 mL/min (ref >60)
GFR calc non Af Amer: >60 mL/min (ref >60)
GLUCOSE: 94 mg/dL (ref 70–99)
POTASSIUM: 3.9 mmol/L (ref 3.5–5.1)
SODIUM: 139 mmol/L (ref 135–145)

## 2017-10-07 MED ORDER — MELOXICAM 7.5 MG PO TABS: 7.5000 mg | ORAL_TABLET | Freq: Every day | ORAL | 0 refills | Status: DC

## 2017-10-07 MED ORDER — FUROSEMIDE 20 MG PO TABS: ORAL_TABLET | ORAL | 0 refills | Status: DC

## 2017-10-07 MED ORDER — FUROSEMIDE 20 MG PO TABS
20.0000 mg | ORAL_TABLET | Freq: Once | ORAL | Status: AC
  Administered 2017-10-07: 20 mg via ORAL
  Filled 2017-10-07: qty 1

## 2017-10-07 MED ORDER — HYDROCODONE-ACETAMINOPHEN 5-325 MG PO TABS
2.0000 | ORAL_TABLET | ORAL | 0 refills | Status: DC | PRN
Start: 2017-10-07 — End: 2023-01-05

## 2017-10-07 MED ORDER — HYDROCODONE-ACETAMINOPHEN 5-325 MG PO TABS
2.0000 | ORAL_TABLET | Freq: Once | ORAL | Status: AC
  Administered 2017-10-07: 2 via ORAL
  Filled 2017-10-07: qty 2

## 2017-10-07 NOTE — ED Notes (Signed)
Patient verbalizes understanding of discharge instructions. Opportunity for questioning and answers were provided. Armband removed by staff, pt discharged from ED to home via POV  

## 2017-10-07 NOTE — ED Triage Notes (Signed)
Patient c/o bilateral leg swelling and pain x 3-4 weeks. Also c/o SOB that she believes is due to her weight.

## 2017-10-07 NOTE — ED Notes (Signed)
Pt reports bilateral leg pain and swelling as well as left knee pain. Pt denies SOB.

## 2017-10-07 NOTE — ED Provider Notes (Signed)
[facility] Provider Note   CSN: [id number] Arrival date & time: 10/07/17  1853     History   Chief Complaint Chief Complaint  Patient presents with  . Leg Swelling    bilateral    HPI April Warner is a 37 y.o. female.  The history is provided by the patient. No language interpreter was used.  Knee Pain   This is a new problem. The current episode started more than 1 week ago. The problem occurs constantly. The problem has been gradually worsening. The pain is present in the left knee. The quality of the pain is described as aching. The pain is moderate. Associated symptoms include limited range of motion. The treatment provided no relief. There has been no history of extremity trauma.  Pt complains of severe pain in her left knee.  Pt reports she saw an orthopaedist in the past and was told that she needed an MRi.  Pt unable to afford and she was over weight limit for [facility].  Pt also complains of swelling in both feet.  Past Medical History:  Diagnosis Date  . Arthritis    bil knees  . Hypertension   . Knee pain   . Obesity     Patient Active Problem List   Diagnosis Date Noted  . UTI (lower urinary tract infection) 08/27/2013  . DUB (dysfunctional uterine bleeding) 09/04/2012    Past Surgical History:  Procedure Laterality Date  . CESAREAN SECTION    . CHOLECYSTECTOMY    . DILATION AND CURETTAGE OF UTERUS N/A 09/04/2012   Procedure: DILATATION AND CURETTAGE, Hysteroscopy;  Surgeon: Tereso NewcomerUgonna A Anyanwu, MD;  Location: [facility];  Service: Gynecology;  Laterality: N/A;     OB History    Gravida  3   Para  3   Term  2   Preterm  1   AB  0   Living  2     SAB  0   TAB  0   Ectopic  0   Multiple  0   Live Births  3            Home Medications    Prior to Admission medications   Medication Sig Start Date End Date Taking? Authorizing Provider  furosemide (LASIX) 20 MG tablet  One tablet a day prn leg swelling 10/07/17   Elson AreasSofia, Jesselle Laflamme K, PA-C  HYDROcodone-acetaminophen (NORCO/VICODIN) 5-325 MG tablet Take 2 tablets by mouth every 4 (four) hours as needed. 10/07/17   Elson AreasSofia, Deema Juncaj K, PA-C  meloxicam (MOBIC) 7.5 MG tablet Take 1 tablet (7.5 mg total) by mouth daily. 10/07/17   Elson AreasSofia, Lynzie Cliburn K, PA-C    Family History Family History  Problem Relation Age of Onset  . Diabetes Maternal Aunt   . Cancer Maternal Aunt   . Diabetes Maternal Grandmother   . Cancer Paternal Grandmother     Social History Social History   Tobacco Use  . Smoking status: Current Every Day Smoker    Packs/day: 0.50    Types: Cigarettes  Substance Use Topics  . Alcohol use: No  . Drug use: No     Allergies   Toradol [ketorolac tromethamine]   Review of Systems Review of Systems  Respiratory: Negative for cough and shortness of breath.   Cardiovascular: Negative for chest pain.  Musculoskeletal: Positive for arthralgias.  All other systems reviewed and are negative.    Physical Exam Updated Vital Signs BP 140/89  Pulse 85   Temp 98 F (36.7 C)   Resp 20   Ht 5\' 9"  (1.753 m)   Wt (!) 172.4 kg   LMP 07/07/2015 (Approximate)   SpO2 99%   BMI 56.12 kg/m   Physical Exam  Constitutional: She is oriented to person, place, and time. She appears well-developed and well-nourished.  HENT:  Head: Normocephalic.  Eyes: EOM are normal.  Neck: Normal range of motion.  Pulmonary/Chest: Effort normal.  Abdominal: She exhibits no distension.  Musculoskeletal: She exhibits edema.  Edema  bilat lower extremities,   Tender left knee,  Pain with movement,  No medial or lateral instability    Neurological: She is alert and oriented to person, place, and time.  Skin: Skin is warm.  Psychiatric: She has a normal mood and affect.  Nursing note and vitals reviewed.    ED Treatments / Results  Labs (all labs ordered are listed, but only abnormal results are displayed) Labs  Reviewed  BASIC METABOLIC PANEL - Abnormal; Notable for the following components:      Result Value   Calcium 8.5 (*)    All other components within normal limits  CBC - Abnormal; Notable for the following components:   Hemoglobin 10.8 (*)    HCT 34.4 (*)    MCV 77.1 (*)    MCH 24.2 (*)    RDW 17.3 (*)    All other components within normal limits    EKG None  Radiology Dg Chest 2 View  Result Date: 10/07/2017 CLINICAL DATA:  Shortness of breath and leg swelling today. EXAM: CHEST - 2 VIEW COMPARISON:  10/02/2013 FINDINGS: Lungs are adequately inflated without focal airspace consolidation or effusion. Cardiomediastinal silhouette, bones and soft tissues are normal. IMPRESSION: No active cardiopulmonary disease. Electronically Signed   By: Elberta Fortis M.D.   On: 10/07/2017 20:12   Dg Knee Complete 4 Views Left  Result Date: 10/07/2017 CLINICAL DATA:  Generalized left knee pain.  No injury. EXAM: LEFT KNEE - COMPLETE 4+ VIEW COMPARISON:  None. FINDINGS: No evidence of fracture, dislocation, or joint effusion. No evidence of arthropathy or other focal bone abnormality. Soft tissues are unremarkable. IMPRESSION: Negative. Electronically Signed   By: Elberta Fortis M.D.   On: 10/07/2017 22:11    Procedures Procedures (including critical care time)  Medications Ordered in ED Medications  HYDROcodone-acetaminophen (NORCO/VICODIN) 5-325 MG per tablet 2 tablet (2 tablets Oral Given 10/07/17 2234)  furosemide (LASIX) tablet 20 mg (20 mg Oral Given 10/07/17 2234)     Initial Impression / Assessment and Plan / ED Course  I have reviewed the triage vital signs and the nursing notes.  Pertinent labs & imaging results that were available during my care of the patient were reviewed by me and considered in my medical decision making (see chart for details).     MDM  Chest xray reviewed and discussed with pt.  Xray left knee no acute abnormality  I discussed follow up with wellness clinic  with pt.  Pt will call tomorrow   Meds ordered this encounter  Medications  . HYDROcodone-acetaminophen (NORCO/VICODIN) 5-325 MG per tablet 2 tablet  . furosemide (LASIX) 20 MG tablet    Sig: One tablet a day prn leg swelling    Dispense:  30 tablet    Refill:  0    Order Specific Question:   Supervising Provider    Answer:   MILLER, BRIAN [[id number]]  . HYDROcodone-acetaminophen (NORCO/VICODIN) 5-325 MG tablet    Sig:  Take 2 tablets by mouth every 4 (four) hours as needed.    Dispense:  10 tablet    Refill:  0    Order Specific Question:   Supervising Provider    Answer:   Hyacinth Meeker, BRIAN [[id number]]  . meloxicam (MOBIC) 7.5 MG tablet    Sig: Take 1 tablet (7.5 mg total) by mouth daily.    Dispense:  30 tablet    Refill:  0    Order Specific Question:   Supervising Provider    Answer:   MILLER, BRIAN [[id number]]  . furosemide (LASIX) tablet 20 mg     Final Clinical Impressions(s) / ED Diagnoses   Final diagnoses:  Leg edema  Acute pain of left knee    ED Discharge Orders         Ordered    furosemide (LASIX) 20 MG tablet     10/07/17 2228    HYDROcodone-acetaminophen (NORCO/VICODIN) 5-325 MG tablet  Every 4 hours PRN     10/07/17 2228    meloxicam (MOBIC) 7.5 MG tablet  Daily     10/07/17 2228        An After Visit Summary was printed and given to the patient.    Osie Cheeks 10/07/17 2312    Cathren Laine, MD 10/07/17 [phone]

## 2017-11-29 ENCOUNTER — Other Ambulatory Visit: Payer: Self-pay

## 2017-11-29 ENCOUNTER — Emergency Department (HOSPITAL_COMMUNITY)
Admission: EM | Admit: 2017-11-29 | Discharge: 2017-11-29 | Discharge status: Against Medical Advice | Payer: Medicaid Other | Attending: Emergency Medicine | Admitting: Emergency Medicine

## 2017-11-29 ENCOUNTER — Encounter (HOSPITAL_COMMUNITY): Payer: Self-pay | Admitting: Emergency Medicine

## 2017-11-29 DIAGNOSIS — Z5321 Procedure and treatment not carried out due to patient leaving prior to being seen by health care provider: Secondary | ICD-10-CM | POA: Insufficient documentation

## 2017-11-29 DIAGNOSIS — M545 Low back pain: Secondary | ICD-10-CM | POA: Insufficient documentation

## 2017-11-29 NOTE — ED Notes (Signed)
Pt has not been in progressive bed. Primary RN has been aware but was waiting to see if patient came back.  Assumed to be elopement
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
## 2017-11-29 NOTE — ED Provider Notes (Signed)
I went to go evaluate patient in the room and she is out of the room.  Additionally, observed hallway space but could not locate patient.  I did not participate in care of this patient.  Graciella Freer, PA-C.    Maxwell Caul, PA-C 11/29/17 2321    Little, Ambrose Finland, MD 12/01/17 2317

## 2018-03-13 ENCOUNTER — Ambulatory Visit (INDEPENDENT_AMBULATORY_CARE_PROVIDER_SITE_OTHER): Payer: Self-pay | Admitting: Orthopaedic Surgery

## 2021-01-29 NOTE — Discharge Summary (Signed)
 Discharge Summary   PATIENT NAME:  April Warner DOB:  May 22, 1980 MRN:  [id number]   Admission Date:   01/25/2021  2:38 PM                      Attending: Deborah DELENA Aliment, MD   Discharge Date:   01/29/2021              Discharging: Deborah DELENA Aliment, MD   Chief Complaint on Admission:  Chief Complaint  Patient presents with  . Eye Problem    Pt bib LEO, for c/o infection in left eye with blurry vision x 2 years. Was just admitted at [facility] recently but left because she was not getting abx. C/o chronic left leg pain      DISCHARGE DIAGNOSIS: Principal Problem:   Uveitis Active Problems:   Primary hypertension   Type 2 diabetes mellitus (CMS/HCC)   Mood disorder (CMS/HCC)   Morbid obesity (CMS/HCC)  Patient Active Problem List   Diagnosis Date Noted   . 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 to an appreciate input from infectious disease recommendations were placed to for IV penicillin /Valtrex combination however the patient does not want to stay here in handcuffs and leg cuffs, and does not want to go to [facility] facility discussed with her and with her guards at bedside the options, patient has opted to discontinue IV penicillin  which is hourly, 1,000,000 units/h for a total of 24,000,000/day supposedly till January 17 but she declined that especially that she said her test is positive because 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 Morbid obesity (CMS/HCC) 01/26/2021    Assessment & Plan Note:          . Primary hypertension 01/20/2021    Assessment & Plan Note:    Continue with amlodipine, cloninide, metoprolol       . Type 2 diabetes mellitus (CMS/HCC) 01/20/2021    Assessment & Plan Note:         . Gastroesophageal reflux disease without esophagitis 01/20/2021  . Dyslipidemia 01/20/2021  . Mood disorder (CMS/HCC) 01/20/2021    Assessment & Plan Note:    Continue with elavil          Resolved Problems  No resolved problems to display.      Hospital Course: April Warner is a 41 year old female with a past medical history of hypertension, obesity, hyperlipidemia, type 2 diabetes, mood disorder who presented to the [facility] due to blurry vision.  She has blurry vision in both eyes worse in the left eye.  She reports that her vision has been worse over the last 2 years.  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  She was seen at [facility] emergency room on December 28th, 2022 was recommended that she needed lumbar puncture along with IV penicillin .  She is going to be transferred to [facility] but patient left AMA.  Patient denies any eye pain, photophobia chest pain, shortness of breath, nausea vomiting.   In the ED, T-max of 99.6 Fahrenheit, heart rate of 73-88, respiratory rate of 18, pressure 132-146/84-1 10.  CBC and BMP- unremarkable.  Rapid COVID test was negative.  ED provider discussed with the infectious disease docto who recommended lumbar puncture along with starting the patient on IV PCN and acyclovir.   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 to an appreciate input from infectious disease recommendations were placed to for IV penicillin /Valtrex combination however the patient does not want to stay here in handcuffs and leg cuffs, and does not want to go to [facility] facility discussed with her and with her guards at bedside the options, patient has opted to discontinue IV penicillin  which is hourly, 1,000,000 units/h for a total of 24,000,000/day supposedly till January 17 but she declined that especially that she said her test is positive because 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 and she went to the clinic daily for her treatment with insulin per her report and she just wants treatment for her left eye uveitis that she is here for which is 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 Take with 8 oz water. Do not lie down for at least 30 minutes after.   valACYclovir 500 mg tablet Commonly known as: VALTREX Take 2 tablets (1,000 mg total) by mouth 3 (three) times a day for 10 days.     CONTINUE taking these medications   albuterol  HFA 90 mcg/actuation inhaler Commonly known as: PROVENTIL  HFA;VENTOLIN  HFA;PROAIR  HFA   amitriptyline 50 mg tablet Commonly known as: ELAVIL   amLODIPine 10 mg tablet Commonly known as: NORVASC   atorvastatin 20 mg tablet Commonly known  as: LIPITOR   carBAMazepine 200 mg tablet Commonly known as: TEGretol   carbidopa-levodopa 25-100 mg per tablet Commonly known as: SINEMET   chlorthalidone 25 mg tablet Commonly known as: HYGROTON   cloNIDine 0.1 mg tablet Commonly known as: CATAPRES   ibuprofen  600 mg tablet Commonly known as: MOTRIN    ketorolac  0.5 % ophthalmic solution Commonly known as: ACULAR    latanoprost 0.005 % ophthalmic solution Commonly known as: XALATAN   lisinopriL 40 mg tablet Commonly known as: PRINIVIL   melatonin tablet   metFORMIN 500 mg tablet Commonly known as: GLUCOPHAGE   meToPROLol tartrate 50 mg tablet Commonly known as: LOPRESSOR   nitroglycerin 0.4 mg SL tablet Commonly  known as: NITROSTAT   omeprazole 40 mg DR capsule Commonly known as: PriLOSEC Take 1 capsule (40 mg total) by mouth Once Daily.   prednisoLONE sodium phosphate  1 % Drop eye drops Commonly known as: INFLAMASE FORTE   topiramate 25 mg tablet Commonly known as: TopaMAX       Where to Get Your Medications    You can get these medications from any pharmacy   Bring a paper prescription for each of these medications doxycycline  100 mg tablet valACYclovir 500 mg tablet     Follow Up Appointments: No follow-up provider specified.    OBJECTIVE: BP (!) 148/91   Pulse 78   Temp 97 F (36.1 C) (Oral)   Resp 18   Ht 1.753 m (5' 9)   Wt (!) 200 kg (440 lb)   SpO2 96%   BMI 64.98 kg/m  I/O this shift: In: 260 [P.O.:260] Out: -    General Appearance: Alert, cooperative, no distress, appears stated age  Lungs:  Clear to auscultation bilaterally, respirations unlabored  Heart: Regular rate and rhythm, S1, S2 normal, no murmur, rub or gallop  Abdomen:  Soft, non-tender, bowel sounds active,  no masses, no organomegaly  Extremities: Extremities normal, atraumatic, no cyanosis or edema  Neurologic: Non Focal     Laboratory Data: No results found for this or any previous visit (from the past 48  hour(s)).  Imaging: FL LP W Fluoro Diagnostic Narrative: DATE OF SERVICE: 01/26/2021 12:33 pm  EXAM: LUMBAR PUNCTURE UNDER FLUOROSCOPIC GUIDANCE  CLINICAL HISTORY: History of syphilis with vision changes.  TECHNIQUE: Patient was placed in the prone position and fluoroscopic guidance was utilized for this procedure.  Fluoro time: 23 seconds  Performing provider: Waddell Essex, PA-C  Supervising provider: Tinnie Roof, MD  FINDINGS: Specimen sent for lab: Yes.  Complication: No immediate complication.  The risks and benefits of the procedure were explained to the patient, parent(s) or legal guardian including, but not limited to, bleeding, infection, and damage to adjacent structures. Understanding was expressed and all questions were answered. The consenting party agreed to proceed and witnessed written consent was obtained. A timeout was performed to confirm the patient's identity and procedure.  Using fluoroscopic guidance and standard sterile technique 1% lidocaine  was used to perform local anesthesia, a 5 inch 22 gauge spinal needle was then placed in the thecal sac at the L3-L4 level. Approximately 14 ml of clear colorless CSF was collected.  Opening pressure: Not obtained.  Closing Pressure: Not obtained.  The patient tolerated the procedure well. Impression: Successful fluoroscopic guided lumbar puncture.  THIS IS AN ELECTRONICALLY VERIFIED FINAL REPORT 01/26/2021 1:06 PM - Electronically signed by Tinnie Roof  CT Head WO Contrast Narrative: DATE OF SERVICE: 01/26/2021 9:43 am  EXAM: CT HEAD WITHOUT CONTRAST  CLINICAL HISTORY: Neuro deficit, acute, stroke suspected, pre procedureal evaluation for LP.  COMPARISON: No Relevant Comparisons  TECHNIQUE: Axial images were obtained from the base of the skull to the vertex without IV contrast.  Dose lowering technique(s) such as automated exposure control, iterative reconstruction, and mA and/or KV adjustment  for patient size was utilized for this exam.  FINDINGS: Images are affected by streak artifact from dental amalgam, which could obscure pertinent findings. Please note, the images are affected by patient motion which degrades image quality and could obscure pertinent findings.  PARENCHYMA:  No CT evidence of acute large territory/transcortical infarction.  No parenchymal hemorrhage.  No mass effect.  EXTRA-AXIAL COLLECTION:  None  VENTRICULAR SYSTEM: Normal  ORBITS: Normal  PARANASAL SINUSES:  Mild mucosal thickening of the paranasal sinuses.  MASTOID AIR CELLS:  Clear  OSSEOUS STRUCTURES:  Minimal soft tissue swelling along the left parietal scalp.  DURAL SINUSES:  Unremarkable  OTHER: Multiple dental caries with periapical lucencies. Impression: 1. Please note, the images are affected by patient motion which degrades image quality and could obscure pertinent findings. 2.  No CT evidence of acute large territory/transcortical infarction. No parenchymal hemorrhage or mass effect. 3.  If there is persistent clinical concern for acute ischemia, MRI is a more sensitive examination and should be considered as clinically indicated.  THIS IS AN ELECTRONICALLY VERIFIED FINAL REPORT 01/26/2021 9:55 AM - Electronically signed by Alyce Pinal       Condition on Discharge: Stable Discharge Disposition: Discharged to: Prison,,,   This discharge process has taken greater than 30 minutes  Signed Electronically:   Deborah DELENA Aliment, MD 11:40 AM  01/29/2021

## 2023-01-04 ENCOUNTER — Encounter (HOSPITAL_COMMUNITY): Payer: Self-pay | Admitting: Emergency Medicine

## 2023-01-04 ENCOUNTER — Emergency Department (HOSPITAL_COMMUNITY)
Admission: EM | Admit: 2023-01-04 | Discharge: 2023-01-05 | Disposition: A | Discharge status: Home / Self Care | Payer: Medicaid Other | Attending: Emergency Medicine | Admitting: Emergency Medicine

## 2023-01-04 DIAGNOSIS — R7309 Other abnormal glucose: Secondary | ICD-10-CM | POA: Insufficient documentation

## 2023-01-04 DIAGNOSIS — J029 Acute pharyngitis, unspecified: Secondary | ICD-10-CM | POA: Diagnosis present

## 2023-01-04 DIAGNOSIS — R739 Hyperglycemia, unspecified: Secondary | ICD-10-CM

## 2023-01-04 DIAGNOSIS — D649 Anemia, unspecified: Secondary | ICD-10-CM | POA: Diagnosis not present

## 2023-01-04 DIAGNOSIS — J020 Streptococcal pharyngitis: Secondary | ICD-10-CM | POA: Diagnosis not present

## 2023-01-04 NOTE — ED Triage Notes (Addendum)
She states symptoms started yesterday. PT presents with difficulty swallowing and pain in ear and face/jaw. She is swollen on R side of throat and it appears occluded on R side to uvula. uvula is swollen. Pt is denying lower palate swelling but does appear to be drooling. PT is obese so diff to tell baseline vs swelling currently.

## 2023-01-04 NOTE — ED Notes (Addendum)
Nurse received pt resting In bed. 10/10 throat pain. Pt denies fever or recent illness. Advised pt we are waiting for a provider to sign up.

## 2023-01-05 ENCOUNTER — Encounter (HOSPITAL_COMMUNITY): Payer: Self-pay

## 2023-01-05 ENCOUNTER — Emergency Department (HOSPITAL_COMMUNITY): Payer: Medicaid Other

## 2023-01-05 LAB — COMPREHENSIVE METABOLIC PANEL
ALT: 16 U/L (ref 0–44)
AST: 21 U/L (ref 15–41)
Albumin: 3.3 g/dL — ABNORMAL LOW (ref 3.5–5.0)
Alkaline Phosphatase: 79 U/L (ref 38–126)
Anion gap: 7 (ref 5–15)
BUN: 8 mg/dL (ref 6–20)
CO2: 24 mmol/L (ref 22–32)
Calcium: 9 mg/dL (ref 8.9–10.3)
Chloride: 106 mmol/L (ref 98–111)
Creatinine, Ser: 0.5 mg/dL (ref 0.44–1.00)
GFR, Estimated: >60 mL/min (ref >60)
Glucose, Bld: 121 mg/dL — ABNORMAL HIGH (ref 70–99)
Potassium: 4 mmol/L (ref 3.5–5.1)
Sodium: 137 mmol/L (ref 135–145)
Total Bilirubin: 0.7 mg/dL (ref <1.2)
Total Protein: 7.6 g/dL (ref 6.5–8.1)

## 2023-01-05 LAB — CBC WITH DIFFERENTIAL/PLATELET
Abs Immature Granulocytes: 0.04 10*3/uL (ref 0.00–0.07)
Basophils Absolute: 0 10*3/uL (ref 0.0–0.1)
Basophils Relative: 0 %
Eosinophils Absolute: 0.1 10*3/uL (ref 0.0–0.5)
Eosinophils Relative: 1 %
HCT: 34.4 % — ABNORMAL LOW (ref 36.0–46.0)
Hemoglobin: 11.3 g/dL — ABNORMAL LOW (ref 12.0–15.0)
Immature Granulocytes: 0 %
Lymphocytes Relative: 17 %
Lymphs Abs: 2 10*3/uL (ref 0.7–4.0)
MCH: 26.1 pg (ref 26.0–34.0)
MCHC: 32.8 g/dL (ref 30.0–36.0)
MCV: 79.4 fL — ABNORMAL LOW (ref 80.0–100.0)
Monocytes Absolute: 0.6 10*3/uL (ref 0.1–1.0)
Monocytes Relative: 5 %
Neutro Abs: 9.2 10*3/uL — ABNORMAL HIGH (ref 1.7–7.7)
Neutrophils Relative %: 77 %
Platelets: 247 10*3/uL (ref 150–400)
RBC: 4.33 MIL/uL (ref 3.87–5.11)
RDW: 16 % — ABNORMAL HIGH (ref 11.5–15.5)
WBC: 11.8 10*3/uL — ABNORMAL HIGH (ref 4.0–10.5)
nRBC: 0 % (ref 0.0–0.2)

## 2023-01-05 LAB — GROUP A STREP BY PCR: Group A Strep by PCR: DETECTED — AB

## 2023-01-05 LAB — HCG, SERUM, QUALITATIVE: Preg, Serum: NEGATIVE

## 2023-01-05 LAB — MONONUCLEOSIS SCREEN: Mono Screen: NEGATIVE

## 2023-01-05 MED ORDER — AMOXICILLIN 500 MG PO CAPS: 500.0000 mg | ORAL_CAPSULE | Freq: Three times a day (TID) | ORAL | 0 refills | Status: DC

## 2023-01-05 MED ORDER — SODIUM CHLORIDE 0.9 % IV BOLUS
1000.0000 mL | Freq: Once | INTRAVENOUS | Status: AC
  Administered 2023-01-05: 1000 mL via INTRAVENOUS

## 2023-01-05 MED ORDER — HYDROCODONE-ACETAMINOPHEN 5-325 MG PO TABS: 1.0000 | ORAL_TABLET | ORAL | 0 refills | Status: DC | PRN

## 2023-01-05 MED ORDER — MORPHINE SULFATE (PF) 4 MG/ML IV SOLN
4.0000 mg | Freq: Once | INTRAVENOUS | Status: AC
  Administered 2023-01-05: 4 mg via INTRAVENOUS
  Filled 2023-01-05: qty 1

## 2023-01-05 MED ORDER — SODIUM CHLORIDE 0.9 % IV SOLN
3.0000 g | Freq: Four times a day (QID) | INTRAVENOUS | Status: DC
  Administered 2023-01-05: 3 g via INTRAVENOUS
  Filled 2023-01-05: qty 8

## 2023-01-05 MED ORDER — IOHEXOL 300 MG/ML  SOLN
75.0000 mL | Freq: Once | INTRAMUSCULAR | Status: AC | PRN
  Administered 2023-01-05: 75 mL via INTRAVENOUS

## 2023-01-05 MED ORDER — METHYLPREDNISOLONE SODIUM SUCC 125 MG IJ SOLR
125.0000 mg | Freq: Once | INTRAMUSCULAR | Status: AC
  Administered 2023-01-05: 125 mg via INTRAVENOUS
  Filled 2023-01-05: qty 2

## 2023-01-05 MED ORDER — PREDNISONE 50 MG PO TABS: 50.0000 mg | ORAL_TABLET | Freq: Every day | ORAL | 0 refills | Status: DC

## 2023-01-05 NOTE — ED Provider Notes (Signed)
[place] EMERGENCY DEPARTMENT AT [facility] Provider Note   CSN: [id number] Arrival date & time: 01/04/23  2205     History  Chief Complaint  Patient presents with   Sore Throat    April Warner is a 42 y.o. female.  The history is provided by the patient.  Sore Throat  She has history of hypertension and comes in complaining of sore throat and inability to swallow since last night.  She denies fever but has had some chills and sweats.  She denies any cough.  She has noted some pain radiating into her right ear.  When she tries to drink water, she states that she chokes on it.  She denies any sick contacts.   Home Medications Prior to Admission medications   Medication Sig Start Date End Date Taking? Authorizing Provider  furosemide (LASIX) 20 MG tablet One tablet a day prn leg swelling 10/07/17   Elson Areas, PA-C  HYDROcodone-acetaminophen (NORCO/VICODIN) 5-325 MG tablet Take 2 tablets by mouth every 4 (four) hours as needed. 10/07/17   Elson Areas, PA-C  meloxicam (MOBIC) 7.5 MG tablet Take 1 tablet (7.5 mg total) by mouth daily. 10/07/17   Elson Areas, PA-C      Allergies    Toradol [ketorolac tromethamine]    Review of Systems   Review of Systems  All other systems reviewed and are negative.   Physical Exam Updated Vital Signs BP (!) 185/105   Pulse 92   Temp 99.2 F (37.3 C) (Oral)   Resp 18   Ht 5\' 9"  (1.753 m)   LMP 07/07/2015 (Approximate)   SpO2 100%   BMI 56.12 kg/m  Physical Exam Vitals and nursing note reviewed.   42 year old female, obviously uncomfortable, but is in no acute distress. Vital signs are significant for elevated blood pressure. Oxygen saturation is 100%, which is normal. Head is normocephalic and atraumatic. PERRLA, EOMI. Oropharynx is clear.  Right tympanic membrane is faintly erythematous, left tympanic membrane is normal.  Oropharynx shows moderate erythema without swelling or exudate.  There is no pooling  of secretions.  Voice is slightly muffled.  There is no stridor Neck is nontender and supple without adenopathy. Lungs are clear without rales, wheezes, or rhonchi. Chest is nontender. Heart has regular rate and rhythm without murmur. Abdomen is soft, flat, nontender. Extremities have no cyanosis or edema, full range of motion is present. Skin is warm and dry without rash. Neurologic: Mental status is normal, moves all extremities equally.  ED Results / Procedures / Treatments   Labs (all labs ordered are listed, but only abnormal results are displayed) Labs Reviewed  GROUP A STREP BY PCR - Abnormal; Notable for the following components:      Result Value   Group A Strep by PCR DETECTED (*)    All other components within normal limits  COMPREHENSIVE METABOLIC PANEL - Abnormal; Notable for the following components:   Glucose, Bld 121 (*)    Albumin 3.3 (*)    All other components within normal limits  CBC WITH DIFFERENTIAL/PLATELET - Abnormal; Notable for the following components:   WBC 11.8 (*)    Hemoglobin 11.3 (*)    HCT 34.4 (*)    MCV 79.4 (*)    RDW 16.0 (*)    Neutro Abs 9.2 (*)    All other components within normal limits  MONONUCLEOSIS SCREEN  HCG, SERUM, QUALITATIVE   Radiology CT Soft Tissue Neck W Contrast Result  Date: 01/05/2023 CLINICAL DATA:  Initial evaluation for suspected epiglottitis or tonsillitis. EXAM: CT NECK WITH CONTRAST TECHNIQUE: Multidetector CT imaging of the neck was performed using the standard protocol following the bolus administration of intravenous contrast. RADIATION DOSE REDUCTION: This exam was performed according to the departmental dose-optimization program which includes automated exposure control, adjustment of the mA and/or kV according to patient size and/or use of iterative reconstruction technique. CONTRAST:  75mL OMNIPAQUE IOHEXOL 300 MG/ML  SOLN COMPARISON:  None Available. FINDINGS: Pharynx and larynx: Oral cavity within normal  limits. Palatine tonsils are enlarged and hypertrophied, suggesting acute tonsillitis, greater on the left. No discrete tonsillar or peritonsillar abscess. Hazy inflammatory stranding within the left parapharyngeal fat. Remainder of the oropharynx and nasopharynx within normal limits. No retropharyngeal collection. Epiglottis within normal limits. Hypopharynx and supraglottic larynx within normal limits. Negative lattice. Subglottic airway clear. Salivary glands: Salivary glands including the parotid and submandibular glands are within normal limits. Subcentimeter nodular density within the inferior right parotid gland noted, favored to reflect a small intraparotid lymph node. Thyroid: Not well assessed due to habitus.  Grossly unremarkable. Lymph nodes: Mildly prominent upper cervical lymph nodes, largest of which measures 1.2 cm at right level 2, presumably reactive. Vascular: Grossly normal intravascular enhancement seen within the neck. Limited intracranial: Unremarkable. Visualized orbits: Not included on this exam. Mastoids and visualized paranasal sinuses: Visualized paranasal sinuses are clear. Visualized mastoids and middle ear cavities are clear. Skeleton: No visible discrete or worrisome osseous lesions. Upper chest: No other visible acute abnormality. Other: None. IMPRESSION: 1. Findings consistent with acute tonsillitis, more pronounced on the left. No discrete tonsillar or peritonsillar abscess. 2. Mildly prominent upper cervical lymph nodes, presumably reactive. Electronically Signed   By: Rise Mu M.D.   On: 01/05/2023 03:00    Procedures Procedures  Cardiac monitor shows normal sinus rhythm, per my interpretation.  Medications Ordered in ED Medications  Ampicillin-Sulbactam (UNASYN) 3 g in sodium chloride 0.9 % 100 mL IVPB (0 g Intravenous Stopped 01/05/23 0224)  morphine (PF) 4 MG/ML injection 4 mg (has no administration in time range)  sodium chloride 0.9 % bolus 1,000 mL (0  mLs Intravenous Stopped 01/05/23 0302)  morphine (PF) 4 MG/ML injection 4 mg (4 mg Intravenous Given 01/05/23 0101)  methylPREDNISolone sodium succinate (SOLU-MEDROL) 125 mg/2 mL injection 125 mg (125 mg Intravenous Given 01/05/23 0113)  iohexol (OMNIPAQUE) 300 MG/ML solution 75 mL (75 mLs Intravenous Contrast Given 01/05/23 0209)    ED Course/ Medical Decision Making/ A&P                                 Medical Decision Making Amount and/or Complexity of Data Reviewed Labs: ordered. Radiology: ordered.  Risk Prescription drug management.   Pharyngitis without evidence of peritonsillar abscess on exam.  However, I am concerned about her inability to swallow.  I have ordered laboratory workup of CBC, comprehensive metabolic panel, mononucleosis screen, strep PCR and I have ordered CT of the soft tissues of the neck.  I have ordered a dose of ampicillin-sulbactam and a dose of methylprednisolone and morphine for pain.  I have reviewed her laboratory tests, and my interpretation is positive PCR for strep, mildly elevated random glucose level which will need to be followed as an outpatient, mild leukocytosis which is stress related and mild anemia which is unchanged from prior.  CT scan shows changes of tonsillitis but no abscess or airway obstruction.  Patient is reevaluated and is resting comfortably.  I offered treatment options of single injection of benzathine penicillin versus 10 days of oral antibiotics and patient is opted for 10 days of oral antibiotics.  I am discharging her with a prescription for prednisone, amoxicillin, and a small number of oxycodone-acetaminophen tablets.  Return precautions discussed.  CRITICAL CARE Performed by: Dione Booze Total critical care time: 35 minutes Critical care time was exclusive of separately billable procedures and treating other patients. Critical care was necessary to treat or prevent imminent or life-threatening deterioration. Critical care  was time spent personally by me on the following activities: development of treatment plan with patient and/or surrogate as well as nursing, discussions with consultants, evaluation of patient's response to treatment, examination of patient, obtaining history from patient or surrogate, ordering and performing treatments and interventions, ordering and review of laboratory studies, ordering and review of radiographic studies, pulse oximetry and re-evaluation of patient's condition.  Final Clinical Impression(s) / ED Diagnoses Final diagnoses:  Strep pharyngitis  Elevated random blood glucose level  Normochromic normocytic anemia    Rx / DC Orders ED Discharge Orders          Ordered    predniSONE (DELTASONE) 50 MG tablet  Daily        01/05/23 0353    amoxicillin (AMOXIL) 500 MG capsule  3 times daily        01/05/23 0353    HYDROcodone-acetaminophen (NORCO/VICODIN) 5-325 MG tablet  Every 4 hours PRN,   Status:  Discontinued        01/05/23 0353    HYDROcodone-acetaminophen (NORCO/VICODIN) 5-325 MG tablet  Every 4 hours PRN        01/05/23 0354              Dione Booze, MD 01/05/23 [phone]

## 2023-01-05 NOTE — Discharge Instructions (Signed)
It is very important that you continue taking the antibiotic until it is completely gone.  Do not stop, even if you are feeling better.  Return to the emergency department if you feel that your symptoms are getting worse.

## 2023-01-09 ENCOUNTER — Ambulatory Visit: Payer: Medicaid Other | Admitting: Orthopaedic Surgery

## 2023-01-09 ENCOUNTER — Other Ambulatory Visit (INDEPENDENT_AMBULATORY_CARE_PROVIDER_SITE_OTHER): Payer: Self-pay

## 2023-01-09 VITALS — Ht 69.0 in | Wt >= 6400 oz

## 2023-01-09 DIAGNOSIS — M25561 Pain in right knee: Secondary | ICD-10-CM | POA: Diagnosis not present

## 2023-01-09 DIAGNOSIS — Z6841 Body Mass Index (BMI) 40.0 and over, adult: Secondary | ICD-10-CM | POA: Diagnosis not present

## 2023-01-09 DIAGNOSIS — G8929 Other chronic pain: Secondary | ICD-10-CM | POA: Diagnosis not present

## 2023-01-09 NOTE — Progress Notes (Unsigned)
Office Visit Note   Patient: April Warner           Date of Birth: 27-Dec-1980           MRN: [id number] Visit Date: 01/09/2023              Requested by: No referring provider defined for this encounter. PCP: Patient, No Pcp Per   Assessment & Plan: Visit Diagnoses:  1. Chronic pain of right knee     Plan: Patient has some point may require total knee arthroplasty she already has significant arthritic changes at age 42.  We discussed water aerobics, dieting, weight loss, consideration of weight loss surgery and we discussed free seminars that she could attend to get more information on this.  Offered injection in her knee patient refused.  Continue topical, ibuprofen, Biofreeze.  She understands she will get improvement with her knee symptoms with weight loss.  Follow-Up Instructions: No follow-ups on file.   Orders:  Orders Placed This Encounter  Procedures   XR KNEE 3 VIEW RIGHT   No orders of the defined types were placed in this encounter.     Procedures: No procedures performed   Clinical Data: No additional findings.   Subjective: Chief Complaint  Patient presents with   Right Knee - Pain    HPI 42 year old female here with right knee pain and arthritis.  She states she has had some pain in her knee since age 73.  She has on a ankle brace is states she is a bit she will fell and recently out of jail.  She has tried ibuprofen Tylenol Biofreeze all rubs.  She has pain swelling difficulty walking.  Her BMI is 67.  Review of Systems all systems noncontributory to HPI.   Objective: Vital Signs: Ht 5\' 9"  (1.753 m)   Wt (!) 460 lb (208.7 kg)   LMP 07/07/2015 (Approximate)   BMI 67.93 kg/m   Physical Exam Constitutional:      Appearance: She is well-developed.  HENT:     Head: Normocephalic.     Right Ear: External ear normal.     Left Ear: External ear normal. There is no impacted cerumen.  Eyes:     Pupils: Pupils are equal, round, and reactive to  light.  Neck:     Thyroid: No thyromegaly.     Trachea: No tracheal deviation.  Cardiovascular:     Rate and Rhythm: Normal rate.  Pulmonary:     Effort: Pulmonary effort is normal.  Abdominal:     Palpations: Abdomen is soft.  Musculoskeletal:     Cervical back: No rigidity.  Skin:    General: Skin is warm and dry.  Neurological:     Mental Status: She is alert and oriented to person, place, and time.  Psychiatric:        Behavior: Behavior normal.     Ortho Exam knee crepitus right ankle bracelet negative logroll hips no pitting edema no calf tenderness.  10 degrees of clinical valgus.  Specialty Comments:  No specialty comments available.  Imaging: No results found.   PMFS History: Patient Active Problem List   Diagnosis Date Noted   Lower urinary tract infectious disease 08/27/2013   DUB (dysfunctional uterine bleeding) 09/04/2012   Past Medical History:  Diagnosis Date   Arthritis    bil knees   Hypertension    Knee pain    Obesity     Family History  Problem Relation Age of Onset  Diabetes Maternal Aunt    Cancer Maternal Aunt    Diabetes Maternal Grandmother    Cancer Paternal Grandmother     Past Surgical History:  Procedure Laterality Date   CESAREAN SECTION     CHOLECYSTECTOMY     DILATION AND CURETTAGE OF UTERUS N/A 09/04/2012   Procedure: DILATATION AND CURETTAGE, Hysteroscopy;  Surgeon: Tereso Newcomer, MD;  Location: [facility];  Service: Gynecology;  Laterality: N/A;   Social History   Occupational History   Not on file  Tobacco Use   Smoking status: Every Day    Current packs/day: 0.50    Types: Cigarettes   Smokeless tobacco: Not on file  Substance and Sexual Activity   Alcohol use: No   Drug use: No   Sexual activity: Yes    Birth control/protection: None

## 2023-01-16 ENCOUNTER — Encounter (INDEPENDENT_AMBULATORY_CARE_PROVIDER_SITE_OTHER): Payer: Medicaid Other | Admitting: Adult Health

## 2023-03-01 NOTE — Progress Notes (Signed)
 Subjective    April Warner is a 43 year old female here for Complete Physical Exam (New pt in for a physical. Mentions wanting to lose weight. Aches/pains all over body. Has multiple hernia's. She ran out of all meds so she hasn't taken any in about a month. Hasn't had mammo in past 2 yrs.) and Immunization (Due for TDAP, HepB, pneumo., flu and covid. Still contemplating on accepting.)   Pt into the office to establish care. FIT program. She will not get a CPE on this exam as she has multiple acute issues and it is the first time seeing the pt.   No meds since 12/2022. Notes she was given 1 month worth after release.   No pap  - s/p hysterectomy Mammogram - needs to have one  Obesity - would like to discuss losing weight at some point. No current meds   Hypertension  This is a chronic problem. The current episode started more than 1 week ago. The problem has not changed (no meds in almost 2 months) since onset.Pertinent negatives include no chest pain, no palpitations and no shortness of breath. Risk factors include obesity.     Documentation Reviewed by Any User: Tobacco  Allergies  Meds  Med Hx   Surg Hx  Fam Hx  Soc Hx    Current Outpatient Medications  Medication Sig Dispense Refill  . atorvastatin (LIPITOR) 40 mg tablet Take 40 mg by mouth nightly at bedtime    . cloNIDine (CATAPRES) 0.1 mg tablet Take 0.1 mg by mouth 2 (two) times daily    . losartan (COZAAR) 25 mg tablet Take 25 mg by mouth once daily Indications: high blood pressure    . metFORMIN (GLUCOPHAGE) 500 mg tablet Take 500 mg by mouth once daily with breakfast Indications: prevention of type 2 diabetes mellitus    . metoprolol tartrate (LOPRESSOR) 50 mg tablet Take 50 mg by mouth twice a day    . NFDB - MELATONING 3MG  PROLONGED RELEASE (NFDB - MELATONIN 3MG  PROLONGED RELEASE) Take 1 Tablet by mouth nightly at bedtime as needed    . potassium chloride (MICRO-K 10 EXTENCAPS) 10 mEq CR capsule Take 10 mEq by mouth once  daily Indications: prevention of low potassium in the blood    . vitamin D3-vitamin K2 1,250-200 mcg cap Take 1 Capsule by mouth every 7 (seven) days Give on Mondays     Review of Systems  Respiratory:  Negative for shortness of breath.   Cardiovascular:  Negative for chest pain and palpitations.  Gastrointestinal:  Negative for abdominal pain.  Musculoskeletal:  Positive for myalgias.     Objective    BP (!) 160/88  Pulse 84  Temp 97.6 F (36.4 C)  Ht 5' 9 (1.753 m)  Wt (!) 482 lb (218.6 kg)  LMP  (LMP Unknown)  SpO2 99%  BMI 71.18 kg/m  OB Status Hysterectomy  Smoking Status Every Day  BSA 3.26 m  Physical Exam  Constitutional:      Appearance: She has morbid obesity HENT:     Head: Normocephalic.   Cardiovascular:     Rate and Rhythm: Normal rate and regular rhythm.     Heart sounds: Normal heart sounds.  Pulmonary:     Breath sounds: Normal breath sounds.  Neurological:     Mental Status: She is oriented to person, place, and time.  Psychiatric:        Attention and Perception: Attention normal.        Mood and Affect:  Mood normal.        Behavior: Behavior normal.     Assessment and Plan    1. Primary hypertension (Primary) 2. Morbid obesity (HCC-CMS) 3. Dyslipidemia 4. Type 2 diabetes mellitus without complication, without long-term current use of insulin (HCC-CMS) Other orders -     metFORMIN (GLUCOPHAGE) 500 mg tablet; Take 1 Tablet by mouth 2 (two) times daily Indications: prevention of type 2 diabetes mellitus, Disp-60 Tablet, R-3, e-Prescribing, Long-term  Dispense: 60 Tablet; Refill: 3 -     atorvastatin (LIPITOR) 40 mg tablet; Take 1 Tablet by mouth nightly at bedtime, Disp-30 Tablet, R-3  e-Prescribing, Long-term  Dispense: 30 Tablet; Refill: 3 -     cloNIDine (CATAPRES) 0.1 mg tablet; Take 1 Tablet by mouth 2 (two) times daily, Disp-60 Tablet, R-3  e-Prescribing, Long-term  Dispense: 60 Tablet; Refill: 3 -     hydroCHLOROthiazide (HYDRODIURIL)  25 mg tablet; Take 1 Tablet by mouth once daily, Disp-30 Tablet, R-3  e-Prescribing, Long-term  Dispense: 30 Tablet; Refill: 3   Regulatory Documentation: The following were addressed in today's visit:  Regulatory documentation not addressed.

## 2023-03-06 ENCOUNTER — Telehealth (INDEPENDENT_AMBULATORY_CARE_PROVIDER_SITE_OTHER): Payer: Self-pay | Admitting: Primary Care

## 2023-03-06 NOTE — Telephone Encounter (Signed)
Called pt to remind them about atp. VM left

## 2023-03-07 ENCOUNTER — Telehealth (INDEPENDENT_AMBULATORY_CARE_PROVIDER_SITE_OTHER): Payer: Self-pay | Admitting: Primary Care

## 2023-03-07 ENCOUNTER — Telehealth (INDEPENDENT_AMBULATORY_CARE_PROVIDER_SITE_OTHER): Payer: Medicaid Other | Admitting: Primary Care

## 2023-03-07 DIAGNOSIS — Z7689 Persons encountering health services in other specified circumstances: Secondary | ICD-10-CM

## 2023-03-07 DIAGNOSIS — Z0000 Encounter for general adult medical examination without abnormal findings: Secondary | ICD-10-CM

## 2023-03-07 NOTE — Telephone Encounter (Signed)
Called pt to see if she was interested in doing a virtual video so that pt does not miss atp. I was able to contact pt and set atp up.

## 2023-03-07 NOTE — Progress Notes (Signed)
  [facility]  Virtual Visit Note  I connected with MCKINLEIGH SCHUCHART, on 03/07/2023 at 2:39 PM through an audio and video application and verified that I am speaking with the correct person using two identifiers.   Consent: I discussed the limitations, risks, security and privacy concerns of performing an evaluation and management service by mychart and the availability of in person appointments. I also discussed with the patient that there may be a patient responsible charge related to this service. The patient expressed understanding and agreed to proceed.   Location of Patient: Home  Location of Provider: [facility]   Persons participating in visit: Odette Fraction,  NP   History of Present Illness: April Warner is a 43 year old female who was having a virtual visit to establish care.  Explained to patient she just saw Mrs. Manson Passey at [place] on March 01, 2023 and establish care.  You can not have 2 providers.  All of her medication was refilled at that visit.  Patient then explained that she enroll in their fit program where they pay for her medication and deliver them and that would be financially better for her.  Although [facility] is closer to her.   Past Medical History:  Diagnosis Date   Arthritis    bil knees   Hypertension    Knee pain    Obesity    Allergies  Allergen Reactions   Toradol [Ketorolac Tromethamine] Diarrhea and Nausea And Vomiting    Current Outpatient Medications on File Prior to Visit  Medication Sig Dispense Refill   amoxicillin (AMOXIL) 500 MG capsule Take 1 capsule (500 mg total) by mouth 3 (three) times daily. 30 capsule 0   furosemide (LASIX) 20 MG tablet One tablet a day prn leg swelling 30 tablet 0   HYDROcodone-acetaminophen (NORCO/VICODIN) 5-325 MG tablet Take 1 tablet by mouth every 4 (four) hours as needed. 10 tablet 0   meloxicam (MOBIC) 7.5 MG tablet Take  1 tablet (7.5 mg total) by mouth daily. 30 tablet 0   predniSONE (DELTASONE) 50 MG tablet Take 1 tablet (50 mg total) by mouth daily. 5 tablet 0   No current facility-administered medications on file prior to visit.   SEE HPI I discussed the assessment and treatment plan with the patient. The patient was provided an opportunity to ask questions and all were answered. The patient agreed with the plan and demonstrated an understanding of the instructions.   The patient was advised to call back or seek an in-person evaluation if the symptoms worsen or if the condition fails to improve as anticipated.     I provided 15 minutes total time during this encounter including median intraservice time, reviewing previous notes, investigations, ordering medications, medical decision making, coordinating care and patient verbalized understanding at the end of the visit.    This note has been created with Education officer, environmental. Any transcriptional errors are unintentional.   Grayce Sessions, NP 03/07/2023, 2:39 PM

## 2023-04-19 NOTE — Progress Notes (Signed)
 MEDICAL NUTRITION THERAPY INITIAL ASSESSMENT  ASSESSMENT Goals for patient: Lose weight: 50 pounds Pt priority/concerns: increasing in weight despite attempts to quit  Past Medical History:  Diagnosis Date  . Acute pain of right knee 01/26/2023  . Anemia   . Arthritis   . Asthma   . Elevated random blood glucose level 01/26/2023  . High cholesterol   . History of blood transfusion   . Hypertension   . Normochromic normocytic anemia 01/26/2023     Biochemical Data SMBG Data  Labs:   Lab Results  Component Value Date   HGBA1C 6.5 (H) 01/31/2023   Lab Results  Component Value Date   TSH 2.300 01/31/2023   Lab Results  Component Value Date   TRIGLYC 107 01/31/2023   CHOL 184 01/31/2023   HDL 40 01/31/2023   LDL 124 (H) 01/31/2023   VLDL 20 01/31/2023   Lab Results  Component Value Date   AST 10 01/31/2023   Lab Results  Component Value Date   ALT 13 01/31/2023   No results found for: MICROALBUMIN Lab Results  Component Value Date   EGFR 116 01/31/2023   Lab Results  Component Value Date   WBC 7.0 01/31/2023   NEUTROPHILS 4.2 01/31/2023   LYMPHOCYTES 2.3 01/31/2023   RBC 4.70 01/31/2023   HGB 11.8 01/31/2023   HCT 36.1 01/31/2023   MCV 77 (L) 01/31/2023   MCH 25.1 (L) 01/31/2023   MCHC 32.7 01/31/2023   RDW 15.4 01/31/2023   PLATELETS 211 01/31/2023   MONOCYTES 0.5 01/31/2023   EOSINOPHILS 0.1 01/31/2023   BASOPHILS 0.0 01/31/2023      DM Meds as taken   Current Outpatient Medications  Medication Sig Dispense Refill  . amLODIPine (NORVASC) 5 mg tablet Take 1 Tablet by mouth once daily 30 Tablet 3  . atorvastatin (LIPITOR) 40 mg tablet Take 1 Tablet by mouth nightly at bedtime 30 Tablet 3  . metFORMIN (GLUCOPHAGE) 500 mg tablet Take 1 Tablet by mouth 2 (two) times daily Indications: prevention of type 2 diabetes mellitus 60 Tablet 3  . semaglutide (OZEMPIC) 0.25 mg or 0.5 mg(2 mg/1.5 mL) pen injector Inject 0.25 mg into the skin once a week 1  Pen 1  . cloNIDine (CATAPRES) 0.1 mg tablet Take 1 Tablet by mouth 2 (two) times daily 60 Tablet 3  . hydroCHLOROthiazide (HYDRODIURIL) 25 mg tablet Take 1 Tablet by mouth once daily 30 Tablet 3  . potassium chloride (MICRO-K 10 EXTENCAPS) 10 mEq CR capsule Take 10 mEq by mouth once daily Indications: prevention of low potassium in the blood    . vitamin D3-vitamin K2 1,250-200 mcg cap Take 1 Capsule by mouth every 7 (seven) days Give on Mondays     No current facility-administered medications for this visit.    Social/Psychosocial/Lifestyle Schedule: no employed Sleep: has sleep apnea Activity/Exercise: none  Food/Nutrition-related History Recent diet changes: started to drink more water Food Allergies/Intolerances: none Significant likes/dislikes:   Appetite:  eats once per day Who shops/prepares foods at home?:  pt How often are foods prepared away from home eaten: 2-3/wk Usual eating pattern (meals/snacks):  1 meal per day and snack Usual beverages (excluding EtOH):  pepsi x 3 per day EtOH: none Diet recall/usual diet:  (Food diary given)  Anthropometrics/Vitals Body mass index is 71.33 kg/m. Wt Readings from Last 5 Encounters:  04/19/23 (!) 483 lb (219.1 kg)  03/01/23 (!) 482 lb (218.6 kg)   NUTRITION DIAGNOSIS & RELATED CONCERNS Nutrition diagnosis (es) 1. Morbid  obesity (HCC-CMS) (Primary) 2. Type 2 diabetes mellitus without complication, without long-term current use of insulin (HCC-CMS) 3. Dyslipidemia Other orders -     atorvastatin (LIPITOR) 40 mg tablet; Take 1 Tablet by mouth nightly at bedtime, Disp-30 Tablet, R-3  e-Prescribing, Long-term  Dispense: 30 Tablet; Refill: 3 -     metFORMIN (GLUCOPHAGE) 500 mg tablet; Take 1 Tablet by mouth 2 (two) times daily Indications: prevention of type 2 diabetes mellitus, Disp-60 Tablet, R-3  e-Prescribing, Long-term  Dispense: 60 Tablet; Refill: 3 -     semaglutide (OZEMPIC) 0.25 mg or 0.5 mg(2 mg/1.5 mL) pen injector;  Inject 0.25 mg into the skin once a week, Disp-1 Pen, R-1  e-Prescribing, Long-term  Dispense: 1 Pen; Refill: 1 -     amLODIPine (NORVASC) 5 mg tablet; Take 1 Tablet by mouth once daily, Disp-30 Tablet, R-3  e-Prescribing, Long-term  Dispense: 30 Tablet; Refill: 3   INTERVENTION (Education/Coordination of Care/Counseling) Glycemic targets  Hypoglycemia - causes, prevention, treatment including 15/15 rule Role of exercise/activity, stress mgmt and healthy weight in blood sugar regulation Carbohydrates in r/t BG levels, dietary sources, food labels, portion sizes and rec # svgs/meal to fit into suggested meal plan. ADA/AND Guidelines Emphasis on non-starchy vegetables Minimal added sugars and refined grains Whole foods rather than highly processed foods to the extent possible Meet/trend toward general fiber intake recommendations thru food (veggies, beans/legumes, fruit, whole intact grains) If consuming alcohol, do so in moderation. If not meeting glycemic targets or avoiding further medication a priority, may consider low carbohydrate (26-45% total calorie intake) or very-low carbohydrate (<26% total carbohydrate)  Nutrition Prescription/Recommendations .Comprehension and receptivity:   Pt goals/plan:  Work on fluids, food and exercise  MONITORING & EVALUATION  Return in about 2 weeks (around 05/03/2023) for medications and weight.    Subjective    April Warner is a 43 year old female here for Follow Up (Pt in for f/u on chronic issues. She is worried about her weight and back pain. Pain so bad she can't do aerobic's/physical activity. She has 3 hernias and they're causing her pain.) and Prescription Refill Request (Needs refill on atorvistatin and metformin.)   Obesity - notes that she gained 200 pounds over the past 2 years.  Notes that she was 420 on 11/2022.  Now this is the heaviest weight she has ever been which is 483. Weight loss - trying to drink about 8 (16 ounce) bottles per  day. Doesn't always make it.  She cooks her own food. Only eats about 1 meal per day. Does not eat a lot of sweet foods.  She has been seen by a bariatric specialist in the past. She is having trouble finding one now that will take her insurance  Back pain - excruciating pain since weight gain.   Hereditary - notes that her entire family is Big  Back Pain This is a chronic problem. The current episode started more than 1 year ago. The problem occurs constantly. The problem has been unchanged. Pertinent negatives include no chest pain or coughing. The symptoms are aggravated by walking and standing. She has tried acetaminophen  and NSAIDs for the symptoms. The treatment provided no relief.      Documentation Reviewed by Any User: Tobacco  Allergies  Meds  Med Hx   Surg Hx  Fam Hx  Soc Hx    Current Outpatient Medications  Medication Sig Dispense Refill  . atorvastatin (LIPITOR) 40 mg tablet Take 1 Tablet by mouth nightly at bedtime 30  Tablet 3  . cloNIDine (CATAPRES) 0.1 mg tablet Take 1 Tablet by mouth 2 (two) times daily 60 Tablet 3  . hydroCHLOROthiazide (HYDRODIURIL) 25 mg tablet Take 1 Tablet by mouth once daily 30 Tablet 3  . metFORMIN (GLUCOPHAGE) 500 mg tablet Take 1 Tablet by mouth 2 (two) times daily Indications: prevention of type 2 diabetes mellitus 60 Tablet 3  . potassium chloride (MICRO-K 10 EXTENCAPS) 10 mEq CR capsule Take 10 mEq by mouth once daily Indications: prevention of low potassium in the blood    . vitamin D3-vitamin K2 1,250-200 mcg cap Take 1 Capsule by mouth every 7 (seven) days Give on Mondays     Review of Systems  Respiratory:  Negative for cough.   Cardiovascular:  Negative for chest pain.  Musculoskeletal:  Positive for back pain.     Objective    BP (!) 168/91  Pulse 75  Temp 98.4 F (36.9 C)  Ht 5' 9 (1.753 m)  Wt (!) 483 lb (219.1 kg)  LMP  (LMP Unknown)  SpO2 96%  BMI 71.33 kg/m  OB Status Hysterectomy  Smoking Status Every Day   BSA 3.27 m  Physical Exam  Constitutional:      Appearance: She has morbid obesity HENT:     Head: Normocephalic.   Cardiovascular:     Rate and Rhythm: Normal rate and regular rhythm.     Heart sounds: Normal heart sounds.  Pulmonary:     Breath sounds: Normal breath sounds.  Neurological:     Mental Status: She is oriented to person, place, and time.  Psychiatric:        Attention and Perception: Attention normal.        Behavior: Behavior is cooperative.    Assessment and Plan    1. Morbid obesity (HCC-CMS) (Primary) 2. Type 2 diabetes mellitus without complication, without long-term current use of insulin (HCC-CMS) 3. Dyslipidemia Other orders -     atorvastatin (LIPITOR) 40 mg tablet; Take 1 Tablet by mouth nightly at bedtime, Disp-30 Tablet, R-3  e-Prescribing, Long-term  Dispense: 30 Tablet; Refill: 3 -     metFORMIN (GLUCOPHAGE) 500 mg tablet; Take 1 Tablet by mouth 2 (two) times daily Indications: prevention of type 2 diabetes mellitus, Disp-60 Tablet, R-3  e-Prescribing, Long-term  Dispense: 60 Tablet; Refill: 3 -     semaglutide (OZEMPIC) 0.25 mg or 0.5 mg(2 mg/1.5 mL) pen injector; Inject 0.25 mg into the skin once a week, Disp-1 Pen, R-1  e-Prescribing, Long-term  Dispense: 1 Pen; Refill: 1 -     amLODIPine (NORVASC) 5 mg tablet; Take 1 Tablet by mouth once daily, Disp-30 Tablet, R-3  e-Prescribing, Long-term  Dispense: 30 Tablet; Refill: 3   Regulatory Documentation: The following were addressed in today's visit:  Lifestyle Measures: The patient was counseled regarding nutrition and physical activity weight loss medication prescribed counseling provided

## 2023-04-27 ENCOUNTER — Ambulatory Visit (HOSPITAL_COMMUNITY): Payer: Self-pay | Admitting: Licensed Clinical Social Worker

## 2023-04-27 DIAGNOSIS — F411 Generalized anxiety disorder: Secondary | ICD-10-CM | POA: Insufficient documentation

## 2023-04-27 DIAGNOSIS — F331 Major depressive disorder, recurrent, moderate: Secondary | ICD-10-CM | POA: Insufficient documentation

## 2023-04-27 NOTE — Progress Notes (Signed)
 April Warner is a 43 year old female here today for PPD skin test reading.   PPD was placed on date/time: 04/25/2023 at 11:57am  Administered to RT forearm.   Reactions: None   PPD Result Disposition: Negative   Read on 04/27/23 at 11:59am    Plan: PPD test negative. No further action required. A copy of results given to patient.  Izetta Search, RN
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
## 2023-05-24 NOTE — Progress Notes (Signed)
 Subjective   April Warner is a 43 year old female here for Follow Up (Pt in for f/u on weight and meds. She's wondering why she's still not losing weight. She has been coughing up phlegm, mainly when she wakes up for a while. Tried taking musinex, no help. She is confused as to why she cannot get her Ozempic prescription. Still in need of  CPAP machine. ) and Ear Problem (She has had right ear pain for about a week and it feels stuffy.)   Pt into the office for follow up.   Missed her appointment at last visit.  Obesity - brings her food diary into the office. (Will scan into the chart) Notes that she is not eating lunch on most days. Medications are not yet approved - ozemic Soda - drinking water, and juice (cranapple, crangrape) - mom will not let the pt drink soda Exercise - going to the [facility] and doing aerobic  Pt has a job as a PCA. She is excited. She is in the orientation process.   Cough This is a new problem. The current episode started more than 1 week ago. The problem occurs every few minutes. The cough is Productive of sputum. There has been no fever. Associated symptoms include ear congestion and ear pain. Pertinent negatives include no chest pain and no weight loss. She has tried decongestants for the symptoms. The treatment provided mild relief. She is a smoker. Her past medical history is significant for bronchitis. Her past medical history does not include pneumonia or asthma.  Ear Pain  The history is provided by the Patient. This is a new problem. The current episode started more than 2 days ago. The problem occurs constantly. The problem has been gradually worsening. There has been no fever. Associated symptoms include cough. Pertinent negatives include no abdominal pain. Her past medical history does not include hearing loss. The pain is at a severity of 3-Hurts Even More.      Documentation Reviewed by Any User: Tobacco  Allergies  Meds  Med Hx   Surg Hx  Fam Hx  Soc Hx     Current Outpatient Medications  Medication Sig Dispense Refill  . amLODIPine (NORVASC) 5 mg tablet Take 1 Tablet by mouth once daily 30 Tablet 3  . atorvastatin (LIPITOR) 40 mg tablet Take 1 Tablet by mouth nightly at bedtime 30 Tablet 3  . metFORMIN (GLUCOPHAGE) 500 mg tablet Take 1 Tablet by mouth 2 (two) times daily Indications: prevention of type 2 diabetes mellitus 60 Tablet 3  . semaglutide (OZEMPIC) 0.25 mg or 0.5 mg(2 mg/1.5 mL) pen injector Inject 0.25 mg into the skin once a week 1 Pen 1  . cloNIDine (CATAPRES) 0.1 mg tablet Take 1 Tablet by mouth 2 (two) times daily 60 Tablet 3  . hydroCHLOROthiazide (HYDRODIURIL) 25 mg tablet Take 1 Tablet by mouth once daily 30 Tablet 3  . potassium chloride (MICRO-K 10 EXTENCAPS) 10 mEq CR capsule Take 10 mEq by mouth once daily Indications: prevention of low potassium in the blood    . vitamin D3-vitamin K2 1,250-200 mcg cap Take 1 Capsule by mouth every 7 (seven) days Give on Mondays     Review of Systems  Constitutional:  Negative for weight loss.  HENT:  Positive for ear pain.   Respiratory:  Positive for cough.   Cardiovascular:  Negative for chest pain.  Gastrointestinal:  Negative for abdominal pain.     Objective   BP 125/76  Pulse 79  Temp  97.2 F (36.2 C)  Wt (!) 489 lb 4.8 oz (221.9 kg)  LMP  (LMP Unknown)  SpO2 96%  BMI 72.26 kg/m  OB Status Hysterectomy  Smoking Status Every Day  BSA 3.29 m  Physical Exam  Constitutional:      Appearance: She has obesity HENT:     Head: Normocephalic.     Right Ear: No decreased hearing noted. Tenderness present. Tympanic membrane is injected.     Left Ear: Tympanic membrane normal.     Nose: Nose normal.   Cardiovascular:     Rate and Rhythm: Normal rate and regular rhythm.     Heart sounds: Normal heart sounds.  Pulmonary:     Breath sounds: Normal breath sounds.  Abdominal:     General: Bowel sounds are normal.     Palpations: Abdomen is soft.     Tenderness:  There is no abdominal tenderness.  Neurological:     Mental Status: She is oriented to person, place, and time.  Psychiatric:        Attention and Perception: Attention normal.        Mood and Affect: Mood normal.        Behavior: Behavior normal.     Assessment and Plan   1. Morbid obesity (HCC-CMS) (Primary) 2. Obstructive sleep apnea -     REFERRAL TO PULMONOLOGY -     albuterol  HFA (PROVENTIL  HFA) 90 mcg/actuation inhaler; Inhale 2 Puffs into the lungs every 4 (four) hours as needed for shortness of breath, Disp-18 g, R-1  e-Prescribing, Long-term  Dispense: 18 g; Refill: 1 3. Dyslipidemia 4. Type 2 diabetes mellitus without complication, without long-term current use of insulin (HCC-CMS) 5. Gastroesophageal reflux disease without esophagitis 6. Non-recurrent acute suppurative otitis media of right ear without spontaneous rupture of tympanic membrane Other orders -     amoxicillin  (AMOXIL ) 875 mg tablet; Take 1 Tablet by mouth 2 (two) times daily, Disp-14 Tablet, R-0  e-Prescribing  Dispense: 14 Tablet; Refill: 0 -     ibuprofen  800 mg tablet; Take 1 Tablet by mouth 3 (three) times daily as needed for pain, Disp-60 Tablet, R-0  e-Prescribing  Dispense: 60 Tablet; Refill: 0   Regulatory Documentation: The following were addressed in today's visit:  Lifestyle Measures: The patient was counseled regarding nutrition and physical activity

## 2023-06-13 ENCOUNTER — Ambulatory Visit (HOSPITAL_BASED_OUTPATIENT_CLINIC_OR_DEPARTMENT_OTHER): Admitting: Student

## 2023-06-14 ENCOUNTER — Telehealth: Admitting: Physician Assistant

## 2023-06-14 DIAGNOSIS — Z91199 Patient's noncompliance with other medical treatment and regimen due to unspecified reason: Secondary | ICD-10-CM

## 2023-06-14 NOTE — Progress Notes (Signed)
 The patient no-showed for appointment despite this provider sending direct link with no response and waiting for at least 10 minutes from appointment time for patient to join. They will be marked as a NS for this appointment/time.   Piedad Climes, PA-C

## 2023-06-27 ENCOUNTER — Telehealth: Admitting: Physician Assistant

## 2023-06-27 DIAGNOSIS — G8929 Other chronic pain: Secondary | ICD-10-CM | POA: Diagnosis not present

## 2023-06-27 DIAGNOSIS — M5441 Lumbago with sciatica, right side: Secondary | ICD-10-CM

## 2023-06-27 DIAGNOSIS — M5442 Lumbago with sciatica, left side: Secondary | ICD-10-CM | POA: Diagnosis not present

## 2023-06-27 MED ORDER — NAPROXEN 500 MG PO TABS: 500.0000 mg | ORAL_TABLET | Freq: Two times a day (BID) | ORAL | 0 refills | Status: AC

## 2023-06-27 MED ORDER — BACLOFEN 10 MG PO TABS: 10.0000 mg | ORAL_TABLET | Freq: Three times a day (TID) | ORAL | 0 refills | Status: DC

## 2023-06-27 NOTE — Progress Notes (Signed)
 E-Visit for Back Pain   We are sorry that you are not feeling well.  Here is how we plan to help!  Based on what you have shared with me it looks like you mostly have acute back pain.  Acute back pain is defined as musculoskeletal pain that can resolve in 1-3 weeks with conservative treatment.  I have prescribed Naprosyn  500 mg take one by mouth twice a day non-steroid anti-inflammatory (NSAID) as well as Baclofen  10 mg every eight hours as needed which is a muscle relaxer  Some patients experience stomach irritation or in increased heartburn with anti-inflammatory drugs.  Please keep in mind that muscle relaxer's can cause fatigue and should not be taken while at work or driving.  Back pain is very common.  The pain often gets better over time.  The cause of back pain is usually not dangerous.  Most people can learn to manage their back pain on their own.  Home Care Stay active.  Start with short walks on flat ground if you can.  Try to walk farther each day. Do not sit, drive or stand in one place for more than 30 minutes.  Do not stay in bed. Do not avoid exercise or work.  Activity can help your back heal faster. Be careful when you bend or lift an object.  Bend at your knees, keep the object close to you, and do not twist. Sleep on a firm mattress.  Lie on your side, and bend your knees.  If you lie on your back, put a pillow under your knees. Only take medicines as told by your doctor. Put ice on the injured area. Put ice in a plastic bag Place a towel between your skin and the bag Leave the ice on for 15-20 minutes, 3-4 times a day for the first 2-3 days. 210 After that, you can switch between ice and heat packs. Ask your doctor about back exercises or massage. Avoid feeling anxious or stressed.  Find good ways to deal with stress, such as exercise.  Get Help Right Way If: Your pain does not go away with rest or medicine. Your pain does not go away in 1 week. You have new  problems. You do not feel well. The pain spreads into your legs. You cannot control when you poop (bowel movement) or pee (urinate) You feel sick to your stomach (nauseous) or throw up (vomit) You have belly (abdominal) pain. You feel like you may pass out (faint). If you develop a fever.  Make Sure you: Understand these instructions. Will watch your condition Will get help right away if you are not doing well or get worse.  Your e-visit answers were reviewed by a board certified advanced clinical practitioner to complete your personal care plan.  Depending on the condition, your plan could have included both over the counter or prescription medications.  If there is a problem please reply  once you have received a response from your provider.  Your safety is important to us .  If you have drug allergies check your prescription carefully.    You can use MyChart to ask questions about today's visit, request a non-urgent call back, or ask for a work or school excuse for 24 hours related to this e-Visit. If it has been greater than 24 hours you will need to follow up with your provider, or enter a new e-Visit to address those concerns.  You will get an e-mail in the next two days asking about  your experience.  I hope that your e-visit has been valuable and will speed your recovery. Thank you for using e-visits.    I have spent 5 minutes in review of e-visit questionnaire, review and updating patient chart, medical decision making and response to patient.   Angelia Kelp, PA-C

## 2023-07-17 ENCOUNTER — Encounter

## 2023-07-23 ENCOUNTER — Telehealth (HOSPITAL_COMMUNITY): Payer: Self-pay | Admitting: Licensed Clinical Social Worker

## 2023-07-23 NOTE — Telephone Encounter (Signed)
 Received phone call from pt probation officer. ROI to be faxed to [phone]. Probation officer requests CCA once [facility] received. Officer Dockery provided contact information for cell: [phone]  and email [email or web address]. [email or web address]
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
## 2023-08-10 ENCOUNTER — Encounter (HOSPITAL_COMMUNITY): Payer: Self-pay | Admitting: Psychiatry

## 2023-08-10 ENCOUNTER — Ambulatory Visit (HOSPITAL_COMMUNITY): Payer: Self-pay | Admitting: Psychiatry

## 2023-08-10 DIAGNOSIS — G4733 Obstructive sleep apnea (adult) (pediatric): Secondary | ICD-10-CM

## 2023-08-10 MED ORDER — HYDROXYZINE HCL 25 MG PO TABS: 25.0000 mg | ORAL_TABLET | Freq: Two times a day (BID) | ORAL | 1 refills | Status: AC | PRN

## 2023-08-10 NOTE — Patient Instructions (Signed)
 Thank you for attending your appointment today.  -- START Atarax 25 mg twice daily as needed for panic attacks or sleep -- Continue other medications as prescribed.  Please do not make any changes to medications without first discussing with your provider. If you are experiencing a psychiatric emergency, please call 911 or present to your nearest emergency department. Additional crisis, medication management, and therapy resources are included below.  [facility]  [address], [place], [place] [address] [phone] WALK-IN URGENT CARE 24/7 FOR ANYONE [address] [place], [place]  [phone] Fax: [phone] [email or web address] *Interpreters available *Accepts all insurance and uninsured for Urgent Care needs *Accepts Medicaid and uninsured for outpatient treatment (below)      ONLY FOR [facility]  Below:    Outpatient New Patient Assessment/Therapy Walk-ins:        Monday, Wednesday, and Thursday 8am until slots are full (first come, first served)                   New Patient Psychiatry/Medication Management        Monday-Friday 8am-11am (first come, first served)               For all walk-ins we ask that you arrive by 7:15am, because patients will be seen in the order of arrival.

## 2023-09-04 ENCOUNTER — Telehealth: Admitting: Physician Assistant

## 2023-09-04 DIAGNOSIS — M549 Dorsalgia, unspecified: Secondary | ICD-10-CM

## 2023-09-04 NOTE — Progress Notes (Signed)
  Because of severity and duration of pain, I feel your condition warrants further evaluation and I recommend that you be seen in a face-to-face visit. This is not something we can properly assess virtually nor can we prescribe controlled medications like your Percocet, etc.    NOTE: There will be NO CHARGE for this E-Visit   If you are having a true medical emergency, please call 911.     For an urgent face to face visit, [facility] has multiple urgent care centers for your convenience.  Click the link below for the full list of locations and hours, walk-in wait times, appointment scheduling options and driving directions:  Urgent Care - [address], [place], [place], [place], [place], [place]       Your MyChart E-visit questionnaire answers were reviewed by a board certified advanced clinical practitioner to complete your personal care plan based on your specific symptoms.    Thank you for using e-Visits.

## 2023-09-10 ENCOUNTER — Institutional Professional Consult (permissible substitution): Admitting: Neurology

## 2023-09-10 ENCOUNTER — Encounter: Payer: Self-pay | Admitting: Neurology

## 2023-09-27 ENCOUNTER — Other Ambulatory Visit: Payer: Self-pay

## 2023-09-27 ENCOUNTER — Emergency Department (HOSPITAL_COMMUNITY): Admission: EM | Admit: 2023-09-27 | Discharge: 2023-09-27 | Disposition: A | Discharge status: Home / Self Care

## 2023-09-27 ENCOUNTER — Encounter (HOSPITAL_COMMUNITY): Payer: Self-pay

## 2023-09-27 DIAGNOSIS — R197 Diarrhea, unspecified: Secondary | ICD-10-CM | POA: Insufficient documentation

## 2023-09-27 DIAGNOSIS — K640 First degree hemorrhoids: Secondary | ICD-10-CM | POA: Diagnosis not present

## 2023-09-27 DIAGNOSIS — I10 Essential (primary) hypertension: Secondary | ICD-10-CM | POA: Diagnosis not present

## 2023-09-27 DIAGNOSIS — Z7984 Long term (current) use of oral hypoglycemic drugs: Secondary | ICD-10-CM | POA: Insufficient documentation

## 2023-09-27 DIAGNOSIS — K6289 Other specified diseases of anus and rectum: Secondary | ICD-10-CM | POA: Diagnosis present

## 2023-09-27 LAB — COMPREHENSIVE METABOLIC PANEL WITH GFR
ALT: 15 U/L (ref 0–44)
AST: 17 U/L (ref 15–41)
Albumin: 3 g/dL — ABNORMAL LOW (ref 3.5–5.0)
Alkaline Phosphatase: 73 U/L (ref 38–126)
Anion gap: 12 (ref 5–15)
BUN: 7 mg/dL (ref 6–20)
CO2: 26 mmol/L (ref 22–32)
Calcium: 8.8 mg/dL — ABNORMAL LOW (ref 8.9–10.3)
Chloride: 103 mmol/L (ref 98–111)
Creatinine, Ser: 0.76 mg/dL (ref 0.44–1.00)
GFR, Estimated: >60 mL/min (ref >60)
Glucose, Bld: 110 mg/dL — ABNORMAL HIGH (ref 70–99)
Potassium: 3.5 mmol/L (ref 3.5–5.1)
Sodium: 141 mmol/L (ref 135–145)
Total Bilirubin: 0.2 mg/dL (ref 0.0–1.2)
Total Protein: 6.7 g/dL (ref 6.5–8.1)

## 2023-09-27 LAB — URINALYSIS, ROUTINE W REFLEX MICROSCOPIC
Bacteria, UA: NONE SEEN
Bilirubin Urine: NEGATIVE
Glucose, UA: NEGATIVE mg/dL
Hgb urine dipstick: NEGATIVE
Ketones, ur: NEGATIVE mg/dL
Leukocytes,Ua: NEGATIVE
Nitrite: NEGATIVE
Protein, ur: NEGATIVE mg/dL
Specific Gravity, Urine: 1.012 (ref 1.005–1.030)
pH: 6 (ref 5.0–8.0)

## 2023-09-27 LAB — CBC WITH DIFFERENTIAL/PLATELET
Abs Immature Granulocytes: 0.03 K/uL (ref 0.00–0.07)
Basophils Absolute: 0 K/uL (ref 0.0–0.1)
Basophils Relative: 0 %
Eosinophils Absolute: 0.1 K/uL (ref 0.0–0.5)
Eosinophils Relative: 1 %
HCT: 37.6 % (ref 36.0–46.0)
Hemoglobin: 11.9 g/dL — ABNORMAL LOW (ref 12.0–15.0)
Immature Granulocytes: 0 %
Lymphocytes Relative: 35 %
Lymphs Abs: 3.1 K/uL (ref 0.7–4.0)
MCH: 24.7 pg — ABNORMAL LOW (ref 26.0–34.0)
MCHC: 31.6 g/dL (ref 30.0–36.0)
MCV: 78 fL — ABNORMAL LOW (ref 80.0–100.0)
Monocytes Absolute: 0.5 K/uL (ref 0.1–1.0)
Monocytes Relative: 6 %
Neutro Abs: 5 K/uL (ref 1.7–7.7)
Neutrophils Relative %: 58 %
Platelets: 211 K/uL (ref 150–400)
RBC: 4.82 MIL/uL (ref 3.87–5.11)
RDW: 16.4 % — ABNORMAL HIGH (ref 11.5–15.5)
WBC: 8.8 K/uL (ref 4.0–10.5)
nRBC: 0 % (ref 0.0–0.2)

## 2023-09-27 LAB — PREGNANCY, URINE: Preg Test, Ur: NEGATIVE

## 2023-09-27 LAB — LIPASE, BLOOD: Lipase: 43 U/L (ref 11–51)

## 2023-09-27 MED ORDER — IBUPROFEN 800 MG PO TABS
800.0000 mg | ORAL_TABLET | Freq: Once | ORAL | Status: AC
  Administered 2023-09-27: 800 mg via ORAL
  Filled 2023-09-27: qty 1

## 2023-09-27 MED ORDER — ONDANSETRON 4 MG PO TBDP
4.0000 mg | ORAL_TABLET | Freq: Once | ORAL | Status: AC
  Administered 2023-09-27: 4 mg via ORAL
  Filled 2023-09-27: qty 1

## 2023-09-27 MED ORDER — LIDOCAINE HCL URETHRAL/MUCOSAL 2 % EX GEL
1.0000 | Freq: Once | CUTANEOUS | Status: AC
  Administered 2023-09-27: 1 via TOPICAL
  Filled 2023-09-27: qty 11

## 2023-09-27 MED ORDER — CLONIDINE HCL 0.1 MG PO TABS
0.1000 mg | ORAL_TABLET | Freq: Once | ORAL | Status: AC
  Administered 2023-09-27: 0.1 mg via ORAL
  Filled 2023-09-27: qty 1

## 2023-09-27 NOTE — Discharge Instructions (Addendum)
 It was a pleasure taking part in your care.  As we discussed, you do have a small internal hemorrhoid.  This is most likely causing your pain.  Please begin performing sitz bath's, please read the attached guide with instructions.  Please read attached guide concerning hemorrhoids.  Follow-up with your PCP.  Usually, these will resolve in about 1 week. Please purchase a stool softener such as colace and take this as directed. Please increase fiber intake such as metamucil to help bulken your stool. Return to the ED with any new symptoms.

## 2023-09-27 NOTE — ED Triage Notes (Signed)
 Pt bibgcems from home c/o pain in her rectum after a bowel movement this morning. Describes pain as burning/tearing/ pressure. Pt feeling dizzy during transport. Bp 170/120 Hr 68 Rr 18 94%  Cbg 142

## 2023-09-27 NOTE — ED Notes (Signed)
 Pt ambulated to the bathroom.

## 2023-09-27 NOTE — ED Provider Notes (Signed)
 [place] EMERGENCY DEPARTMENT AT [facility] Provider Note   CSN: [id number] Arrival date & time: 09/27/23  0114     Patient presents with: Rectal Pain   April Warner is a 43 y.o. female with history of obesity, intermittent explosive disorder, MDD, GAD.  Patient presents to ED complaining of rectal pain.  States that she woke up in her usual state of health this morning.  Reports that she breakfast with her brother.  States that around 11 AM she had episode of diarrhea.  States that ever since 11 AM and this bowel movement, she has had progressively worsening rectal pain.  States around 1230 this rectal pain caused her to leave work.  Reports that she has had no bowel movement since initial bowel movement at 11 AM.  She reports a history of hemorrhoids but reports that she last dealt with these 20 years ago when she gave birth to her son.  She denies any blood in stool, abdominal pain, vomiting.  She does endorse slight nausea.  She denies fevers, dysuria, chest pain or shortness of breath.  She denies headache or blurred vision.  She does arrive hypertensive but reports she has not taken her blood pressure medications today.  Reports that her last bowel movement prior to bowel movement today was yesterday, states that she has had no issues with constipation recently.  Denies any receptive anal intercourse.  HPI     Prior to Admission medications   Medication Sig Start Date End Date Taking? Authorizing Provider  atorvastatin (LIPITOR) 40 MG tablet Take 40 mg by mouth daily. 04/19/23   [provider]  baclofen  (LIORESAL ) 10 MG tablet Take 1 tablet (10 mg total) by mouth 3 (three) times daily. 06/27/23   Vivienne Delon HERO, PA-C  cloNIDine  (CATAPRES ) 0.1 MG tablet Take 1 tablet by mouth 2 (two) times daily. 07/03/23   [provider]  hydrOXYzine  (ATARAX ) 25 MG tablet Take 1 tablet (25 mg total) by mouth 2 (two) times daily as needed (panic attacks or sleep).  08/10/23 10/09/23  Bahraini, Sarah A  ibuprofen  (ADVIL ) 600 MG tablet Take 600 mg by mouth every 6 (six) hours as needed for moderate pain (pain score 4-6), mild pain (pain score 1-3) or headache.    [provider]  metFORMIN (GLUCOPHAGE) 500 MG tablet Take 500 mg by mouth 2 (two) times daily with a meal. 08/02/23   [provider]  naproxen  (NAPROSYN ) 500 MG tablet Take 1 tablet (500 mg total) by mouth 2 (two) times daily with a meal. 06/27/23   Burnette, Delon HERO, PA-C  OZEMPIC, 0.25 OR 0.5 MG/DOSE, 2 MG/3ML SOPN Inject 2.5 mg as directed once a week. 08/02/23   [provider]    Allergies: Toradol  [ketorolac  tromethamine ]    Review of Systems  All other systems reviewed and are negative.   Updated Vital Signs BP (!) 173/78   Pulse 84   Temp (!) 97.5 F (36.4 C) (Oral)   Resp 19   LMP 07/07/2015 (Approximate)   SpO2 98%   Physical Exam Vitals and nursing note reviewed. Exam conducted with a chaperone present.  Constitutional:      General: She is not in acute distress.    Appearance: She is well-developed.  HENT:     Head: Normocephalic and atraumatic.  Eyes:     Conjunctiva/sclera: Conjunctivae normal.  Cardiovascular:     Rate and Rhythm: Normal rate and regular rhythm.     Heart sounds: No  murmur heard. Pulmonary:     Effort: Pulmonary effort is normal. No respiratory distress.     Breath sounds: Normal breath sounds.  Abdominal:     Palpations: Abdomen is soft.     Tenderness: There is no abdominal tenderness.  Genitourinary:    Comments: Minimal hemorrhoid noted  Musculoskeletal:        General: No swelling.     Cervical back: Neck supple.  Skin:    General: Skin is warm and dry.     Capillary Refill: Capillary refill takes less than 2 seconds.  Neurological:     Mental Status: She is alert and oriented to person, place, and time. Mental status is at baseline.  Psychiatric:        Mood and Affect: Mood normal.     (all labs  ordered are listed, but only abnormal results are displayed) Labs Reviewed  CBC WITH DIFFERENTIAL/PLATELET - Abnormal; Notable for the following components:      Result Value   Hemoglobin 11.9 (*)    MCV 78.0 (*)    MCH 24.7 (*)    RDW 16.4 (*)    All other components within normal limits  COMPREHENSIVE METABOLIC PANEL WITH GFR - Abnormal; Notable for the following components:   Glucose, Bld 110 (*)    Calcium 8.8 (*)    Albumin 3.0 (*)    All other components within normal limits  URINALYSIS, ROUTINE W REFLEX MICROSCOPIC - Abnormal; Notable for the following components:   APPearance HAZY (*)    All other components within normal limits  LIPASE, BLOOD  PREGNANCY, URINE    EKG: None  Radiology: No results found.  Procedures   Medications Ordered in the ED  cloNIDine  (CATAPRES ) tablet 0.1 mg (0.1 mg Oral Given 09/27/23 0246)  lidocaine  (XYLOCAINE ) 2 % jelly 1 Application (1 Application Topical Given 09/27/23 0246)  ondansetron  (ZOFRAN -ODT) disintegrating tablet 4 mg (4 mg Oral Given 09/27/23 0247)  ibuprofen  (ADVIL ) tablet 800 mg (800 mg Oral Given 09/27/23 0317)      Medical Decision Making Amount and/or Complexity of Data Reviewed Labs: ordered. ECG/medicine tests: ordered.  Risk Prescription drug management.   This is a 43 year old female presenting to the ED out of concern of rectal pain.  On exam, she is afebrile and nontachycardic.  Her lung sounds are clear bilaterally, she is not hypoxic.  Abdomen is soft and compressible.  Neurological examination at baseline.  Rectal examination performed with chaperone RN.  Rectal examination reveals a minimal hemorrhoid to the 5 o'clock position of the rectum that is nonthrombosed.  No indications for reduction.  Labs initiated in triage include CBC, CMP, lipase, urinalysis pregnancy test.  Patient CBC without leukocytosis, no anemia.  Metabolic panel is unremarkable without electrolyte derangement, no elevated LFT, and gap 12.   Urinalysis negative for all.  Lipase 43.  hCG negative.  Patient rectal examination reveals a very minimal internal hemorrhoid.  She had numbing gel applied to her rectum.  She was given ibuprofen .  She reports reduction of pain after these medications were given.  Patient was counseled on sitz bath's at home, counseled on applying Preparation H cooling gel.  She was advised to follow-up with her PCP.  Advised that most of the time these hemorrhoids will resolve on their own.  She was given return precautions and she voiced understanding.  She is stable to discharge.    Final diagnoses:  Grade I hemorrhoids  Rectal pain    ED Discharge Orders  None          Ruthell Lonni JULIANNA DEVONNA 09/27/23 [id number]    Griselda Norris, MD 09/27/23 [phone]

## 2023-10-02 NOTE — Progress Notes (Unsigned)
 Patient did not connect for virtual psychiatric medication management appointment on 10/03/23 at 2:30PM. Sent secure video link with no response. Left VM with callback number to reschedule.  LAURAINE DELENA PUMMEL, MD 10/03/23

## 2023-10-03 ENCOUNTER — Encounter (HOSPITAL_COMMUNITY): Payer: Self-pay

## 2023-10-03 ENCOUNTER — Encounter (HOSPITAL_COMMUNITY): Admitting: Psychiatry

## 2023-10-10 ENCOUNTER — Encounter (INDEPENDENT_AMBULATORY_CARE_PROVIDER_SITE_OTHER): Payer: Self-pay

## 2023-10-11 ENCOUNTER — Telehealth (HOSPITAL_BASED_OUTPATIENT_CLINIC_OR_DEPARTMENT_OTHER): Payer: Self-pay | Admitting: Student

## 2023-10-11 ENCOUNTER — Ambulatory Visit (INDEPENDENT_AMBULATORY_CARE_PROVIDER_SITE_OTHER)

## 2023-10-11 ENCOUNTER — Ambulatory Visit (INDEPENDENT_AMBULATORY_CARE_PROVIDER_SITE_OTHER): Admitting: Student

## 2023-10-11 ENCOUNTER — Telehealth (HOSPITAL_COMMUNITY): Payer: Self-pay

## 2023-10-11 DIAGNOSIS — M1711 Unilateral primary osteoarthritis, right knee: Secondary | ICD-10-CM

## 2023-10-11 DIAGNOSIS — G8929 Other chronic pain: Secondary | ICD-10-CM

## 2023-10-11 DIAGNOSIS — M25561 Pain in right knee: Secondary | ICD-10-CM

## 2023-10-11 MED ORDER — LIDOCAINE HCL 1 % IJ SOLN
4.0000 mL | INTRAMUSCULAR | Status: AC | PRN
  Administered 2023-10-11: 4 mL

## 2023-10-11 MED ORDER — TRIAMCINOLONE ACETONIDE 40 MG/ML IJ SUSP
2.0000 mL | INTRAMUSCULAR | Status: AC | PRN
  Administered 2023-10-11: 2 mL via INTRA_ARTICULAR

## 2023-10-11 NOTE — Progress Notes (Signed)
 Chief Complaint: Right knee pain    Discussed the use of AI scribe software for clinical note transcription with the patient, who gave verbal consent to proceed.  History of Present Illness April Warner is a 43 year old female with chronic right knee pain who presents with worsening symptoms and instability.  She has experienced chronic right knee pain since age 25 following a fall, requiring an immobilizer. Over the years, she has dealt with persistent pain and instability. Recently, she fell down the stairs, worsening her symptoms, and describes the knee as 'giving out'. She received knee injections approximately ten years ago but cannot recall their effectiveness. Surgery was advised in the past, but she did not meet the weight loss requirements. She is currently on a weight loss journey, having lost almost 20 pounds in the last two months by eliminating bread and sodas from her diet as well as starting Ozempic. She experiences pain in the back of the knee, associated with swelling. The knee often pops and cracks during movement and is particularly painful after periods of inactivity. Last A1C was 6.1.   Surgical History:   None  PMH/PSH/Family History/Social History/Meds/Allergies:    Past Medical History:  Diagnosis Date   Anxiety    Arthritis    bil knees   Depression    Hypertension    Intermittent explosive disorder    Knee pain    Obesity    Past Surgical History:  Procedure Laterality Date   CESAREAN SECTION     CHOLECYSTECTOMY     DILATION AND CURETTAGE OF UTERUS N/A 09/04/2012   Procedure: DILATATION AND CURETTAGE, Hysteroscopy;  Surgeon: Gloris DELENA Hugger, MD;  Location: [facility];  Service: Gynecology;  Laterality: N/A;   Social History   Socioeconomic History   Marital status: Single    Spouse name: Not on file   Number of children: Not on file   Years of education: Not on file   Highest education level: Not on file   Occupational History   Not on file  Tobacco Use   Smoking status: Every Day    Current packs/day: 0.50    Types: Cigarettes   Smokeless tobacco: Not on file  Substance and Sexual Activity   Alcohol use: No   Drug use: Not Currently    Comment: past use of cannabis (last March 2025) and cocaine (last in 2019)   Sexual activity: Yes    Birth control/protection: None  Other Topics Concern   Not on file  Social History Narrative   Not on file   Social Drivers of Health   Financial Resource Strain: Not on file  Food Insecurity: Not on file  Transportation Needs: Not on file  Physical Activity: Not on file  Stress: Not on file  Social Connections: Not on file   Family History  Problem Relation Age of Onset   Diabetes Maternal Aunt    Cancer Maternal Aunt    Diabetes Maternal Grandmother    Cancer Paternal Grandmother    Allergies  Allergen Reactions   Toradol  [Ketorolac  Tromethamine ] Diarrhea and Nausea And Vomiting   Current Outpatient Medications  Medication Sig Dispense Refill   atorvastatin (LIPITOR) 40 MG tablet Take 40 mg by mouth daily.     baclofen  (LIORESAL ) 10 MG tablet Take 1 tablet (10 mg total)  by mouth 3 (three) times daily. 30 each 0   cloNIDine  (CATAPRES ) 0.1 MG tablet Take 1 tablet by mouth 2 (two) times daily.     ibuprofen  (ADVIL ) 600 MG tablet Take 600 mg by mouth every 6 (six) hours as needed for moderate pain (pain score 4-6), mild pain (pain score 1-3) or headache.     metFORMIN (GLUCOPHAGE) 500 MG tablet Take 500 mg by mouth 2 (two) times daily with a meal.     naproxen  (NAPROSYN ) 500 MG tablet Take 1 tablet (500 mg total) by mouth 2 (two) times daily with a meal. 30 tablet 0   OZEMPIC, 0.25 OR 0.5 MG/DOSE, 2 MG/3ML SOPN Inject 2.5 mg as directed once a week.     No current facility-administered medications for this visit.   No results found.  Review of Systems:   A ROS was performed including pertinent positives and negatives as documented in  the HPI.  Physical Exam :   Constitutional: NAD and appears stated age Neurological: Alert and oriented Psych: Appropriate affect and cooperative Last menstrual period 07/07/2015.   Comprehensive Musculoskeletal Exam:    Exam of the right knee demonstrates tenderness with palpation over the medial joint line.  Active range of motion is from 0 to 90 degrees.  Calf nontender.  Imaging:   Xray (right knee 4 views): Moderate tricompartmental osteoarthritis with valgus angulation, joint space narrowing, and osteophyte formation.   I personally reviewed and interpreted the radiographs.      Assessment & Plan Right knee osteoarthritis   Chronic right knee osteoarthritis presents with moderate to advanced arthritis in all compartments, causing pain, swelling, instability, and occasional giving out. Weight loss will help manage symptoms. Her A1c of 6.1 permits a steroid injection. Administer a steroid injection in the right knee. Encourage weight loss through dietary changes and low-impact exercises such as cycling, walking, and aquatic activities. Recommend water aerobics after ankle bracelet removal on November 10.      Procedure Note  Patient: April Warner             Date of Birth: 10-23-1980           MRN: [id number]             Visit Date: 10/11/2023  Procedures: Visit Diagnoses:  1. Unilateral primary osteoarthritis, right knee     Large Joint Inj: R knee on 10/11/2023 11:15 AM Indications: pain Details: 22 G 1.5 in needle, anterolateral approach Medications: 4 mL lidocaine  1 %; 2 mL triamcinolone  acetonide 40 MG/ML Outcome: tolerated well, no immediate complications Procedure, treatment alternatives, risks and benefits explained, specific risks discussed. Consent was given by the patient. Immediately prior to procedure a time out was called to verify the correct patient, procedure, equipment, support staff and site/side marked as required. Patient was prepped and draped  in the usual sterile fashion.       I personally saw and evaluated the patient, and participated in the management and treatment plan.  Leonce Reveal, PA-C Orthopedics

## 2023-10-11 NOTE — Telephone Encounter (Signed)
 Received a ROI from Ms. [name] from [employer] on this patient, requesting an update.  This therapist sends the following email  Good afternoon,  Bette Brienza has an upcoming medication management appointment with Dr. Allyne on 10.28.25 at 1:30pm.  Thank you  Darice Simpler, MS, LMFT, LCAS

## 2023-10-11 NOTE — Telephone Encounter (Signed)
 Patient states that her knee is hurting from the injection and she has never felt pain like that. Please advise [phone]

## 2023-10-12 NOTE — Telephone Encounter (Signed)
 LMOM advising to take ibuprofen  and ice and that flare ups typically last 48 hours

## 2023-10-25 ENCOUNTER — Telehealth (HOSPITAL_BASED_OUTPATIENT_CLINIC_OR_DEPARTMENT_OTHER): Payer: Self-pay | Admitting: Student

## 2023-10-25 NOTE — Telephone Encounter (Signed)
 Patient states that her knee is still in a lot of pain since her injections. Best contact [phone]

## 2023-10-25 NOTE — Telephone Encounter (Signed)
 Called and spoke with patient. She is scheduled to come in tomorrow to see Leonce.

## 2023-10-26 ENCOUNTER — Ambulatory Visit (INDEPENDENT_AMBULATORY_CARE_PROVIDER_SITE_OTHER): Admitting: Student

## 2023-10-26 ENCOUNTER — Other Ambulatory Visit (HOSPITAL_BASED_OUTPATIENT_CLINIC_OR_DEPARTMENT_OTHER): Payer: Self-pay

## 2023-10-26 DIAGNOSIS — M1711 Unilateral primary osteoarthritis, right knee: Secondary | ICD-10-CM | POA: Diagnosis not present

## 2023-10-26 MED ORDER — METHYLPREDNISOLONE 4 MG PO TBPK
ORAL_TABLET | ORAL | 0 refills | Status: DC
  Filled 2023-10-26: qty 21, 6d supply, fill #0

## 2023-10-26 NOTE — Progress Notes (Signed)
 Chief Complaint: Right knee pain    Discussed the use of AI scribe software for clinical note transcription with the patient, who gave verbal consent to proceed.  History of Present Illness  10/26/23: Patient presents today for follow-up of her right knee.  She states that injection performed at last visit gave her 2 to 3 days of relief but pain has returned and has been worsening.  The pain occasionally radiates down her lower leg to the foot.  She does experience consistent crepitus with motion.   April Warner is a 43 year old female with chronic right knee pain who presents with worsening symptoms and instability. She has experienced chronic right knee pain since age 24 following a fall, requiring an immobilizer. Over the years, she has dealt with persistent pain and instability. Recently, she fell down the stairs, worsening her symptoms, and describes the knee as 'giving out'. She received knee injections approximately ten years ago but cannot recall their effectiveness. Surgery was advised in the past, but she did not meet the weight loss requirements. She is currently on a weight loss journey, having lost almost 20 pounds in the last two months by eliminating bread and sodas from her diet as well as starting Ozempic. She experiences pain in the back of the knee, associated with swelling. The knee often pops and cracks during movement and is particularly painful after periods of inactivity. Last A1C was 6.1.   Surgical History:   None  PMH/PSH/Family History/Social History/Meds/Allergies:    Past Medical History:  Diagnosis Date   Anxiety    Arthritis    bil knees   Depression    Hypertension    Intermittent explosive disorder    Knee pain    Obesity    Past Surgical History:  Procedure Laterality Date   CESAREAN SECTION     CHOLECYSTECTOMY     DILATION AND CURETTAGE OF UTERUS N/A 09/04/2012   Procedure: DILATATION AND CURETTAGE,  Hysteroscopy;  Surgeon: Gloris DELENA Hugger, MD;  Location: [facility];  Service: Gynecology;  Laterality: N/A;   Social History   Socioeconomic History   Marital status: Single    Spouse name: Not on file   Number of children: Not on file   Years of education: Not on file   Highest education level: Not on file  Occupational History   Not on file  Tobacco Use   Smoking status: Every Day    Current packs/day: 0.50    Types: Cigarettes   Smokeless tobacco: Not on file  Substance and Sexual Activity   Alcohol use: No   Drug use: Not Currently    Comment: past use of cannabis (last March 2025) and cocaine (last in 2019)   Sexual activity: Yes    Birth control/protection: None  Other Topics Concern   Not on file  Social History Narrative   Not on file   Social Drivers of Health   Financial Resource Strain: Not on file  Food Insecurity: Not on file  Transportation Needs: Not on file  Physical Activity: Not on file  Stress: Not on file  Social Connections: Not on file   Family History  Problem Relation Age of Onset   Diabetes Maternal Aunt    Cancer Maternal Aunt    Diabetes Maternal Grandmother    Cancer  Paternal Grandmother    Allergies  Allergen Reactions   Toradol  [Ketorolac  Tromethamine ] Diarrhea and Nausea And Vomiting   Current Outpatient Medications  Medication Sig Dispense Refill   methylPREDNISolone  (MEDROL  DOSEPAK) 4 MG TBPK tablet Take per packet instructions 1 each 0   atorvastatin (LIPITOR) 40 MG tablet Take 40 mg by mouth daily.     baclofen  (LIORESAL ) 10 MG tablet Take 1 tablet (10 mg total) by mouth 3 (three) times daily. 30 each 0   cloNIDine  (CATAPRES ) 0.1 MG tablet Take 1 tablet by mouth 2 (two) times daily.     ibuprofen  (ADVIL ) 600 MG tablet Take 600 mg by mouth every 6 (six) hours as needed for moderate pain (pain score 4-6), mild pain (pain score 1-3) or headache.     metFORMIN (GLUCOPHAGE) 500 MG tablet Take 500 mg by mouth 2 (two) times daily with a  meal.     naproxen  (NAPROSYN ) 500 MG tablet Take 1 tablet (500 mg total) by mouth 2 (two) times daily with a meal. 30 tablet 0   OZEMPIC, 0.25 OR 0.5 MG/DOSE, 2 MG/3ML SOPN Inject 2.5 mg as directed once a week.     No current facility-administered medications for this visit.   No results found.  Review of Systems:   A ROS was performed including pertinent positives and negatives as documented in the HPI.  Physical Exam :   Constitutional: NAD and appears stated age Neurological: Alert and oriented Psych: Appropriate affect and cooperative Last menstrual period 07/07/2015.   Comprehensive Musculoskeletal Exam:    Right knee active range of motion is from 0 to 100 degrees with palpable crepitus.  Tenderness to palpation over the medial joint line.  Stable collaterals with varus and valgus stress.  No overlying erythema or warmth.  Imaging:        Assessment & Plan Right knee osteoarthritis   Patient has moderate to advanced tricompartmental osteoarthritis of the right knee.  She did not get any long-lasting relief from cortisone injection performed on 9/18.  Patient has primary Medicaid insurance unfortunately gel injections are not likely to be an option.  She is on Ozempic and is currently working hard on weight loss.  Patient's weight recorded today is 480 which is another 9 pounds down from previous.  She is motivated to be physically active however her knee is limiting her significantly.  I think she would be a great candidate for aquatic therapy so we will plan to refer her today.  She does currently have an ankle monitor that is set to come off on 11/10 so aquatics would either need to begin after this or would need to be approved for removal for sessions by her parole officer prior to.     I personally saw and evaluated the patient, and participated in the management and treatment plan.  Leonce Reveal, PA-C Orthopedics

## 2023-10-29 ENCOUNTER — Institutional Professional Consult (permissible substitution) (INDEPENDENT_AMBULATORY_CARE_PROVIDER_SITE_OTHER): Admitting: Nurse Practitioner

## 2023-10-29 NOTE — Progress Notes (Deleted)
 [address] [place], [place], [place] [address] Office: [phone]  /  Fax: [phone]   Initial Consultation    April Warner was seen in clinic today to evaluate for obesity. She is interested in losing weight to improve overall health and reduce the risk of weight related complications. She presents today to review program treatment options, initial physical assessment, and evaluation.    Anthropometrics and Bioimpedance Analysis   There is no height or weight on file to calculate BMI. Body Fat Mass : *** % Visceral Fat Mass Rating : *** Waist to Height Ratio: ***  Obesity Related Diseases and Complications  Obesity Quality of Life and Psychosocial Complications: {emqolpsychosoc:33006::Reduced health-related quality of life}  Cardiometabolic: {emcardiometabolic complications:33007}  Biomechanical: {embiomechanical:33008}   Weight Related History  She was referred by: {emreferby:28303}  When asked what they would like to accomplish? She states: {EMHopetoaccomplish:28304::Adopt a healthier eating pattern and lifestyle,Improve energy levels and physical activity,Improve existing medical conditions,Improve quality of life}  Weight history: ***  Highest weight: ***  Contributing factors: {EMcontributingfactors:28307}  Prior weight loss attempts: {emweightlossprograms:31590::None}  Current or previous pharmacotherapy: {EM previousRx:28311}  Response to medication: {EMResponsetomedication:28312}  Current nutrition plan: {EMNutritionplan:28309::None}  Greatest challenge with dieting: {emgreatestchallengediet:31593}.  Current level of physical activity: {EMcurrentPA:28310::None}  Barriers to Exercise: {embarrierstoexercise:32606::no barriers}  Readiness and Motivation  On a scale from 0 to 10 How ready are you to make changes to your eating and physical activity to lose weight? {NUMBER 1-10:22536} How important is it for you to lose weight right  now ? {NUMBER 1-10:22536} How confident are you that you can lose weight if you try? {NUMBER [id number]  Past Medical History   Past Medical History:  Diagnosis Date   Anxiety    Arthritis    bil knees   Depression    Hypertension    Intermittent explosive disorder    Knee pain    Obesity      Objective    LMP 07/07/2015 (Approximate)  She was weighed on the bioimpedance scale: There is no height or weight on file to calculate BMI.    General:  Alert, oriented and cooperative. Patient is in no acute distress.  Respiratory: Normal respiratory effort, no problems with respiration noted   Gait: able to ambulate independently  Mental Status: Normal mood and affect. Normal behavior. Normal judgment and thought content.   Diagnostic Data Reviewed  BMET    Component Value Date/Time   NA 141 09/27/2023 0129   K 3.5 09/27/2023 0129   CL 103 09/27/2023 0129   CO2 26 09/27/2023 0129   GLUCOSE 110 (H) 09/27/2023 0129   BUN 7 09/27/2023 0129   CREATININE 0.76 09/27/2023 0129   CALCIUM 8.8 (L) 09/27/2023 0129   GFRNONAA >60 09/27/2023 0129   GFRAA >60 10/07/2017 2019   No results found for: HGBA1C No results found for: INSULIN CBC    Component Value Date/Time   WBC 8.8 09/27/2023 0129   RBC 4.82 09/27/2023 0129   HGB 11.9 (L) 09/27/2023 0129   HCT 37.6 09/27/2023 0129   PLT 211 09/27/2023 0129   MCV 78.0 (L) 09/27/2023 0129   MCH 24.7 (L) 09/27/2023 0129   MCHC 31.6 09/27/2023 0129   RDW 16.4 (H) 09/27/2023 0129   Iron/TIBC/Ferritin/ %Sat No results found for: IRON, TIBC, FERRITIN, IRONPCTSAT Lipid Panel  No results found for: CHOL, TRIG, HDL, CHOLHDL, VLDL, LDLCALC, LDLDIRECT Hepatic Function Panel     Component Value Date/Time   PROT 6.7 09/27/2023 0129   ALBUMIN  3.0 (L) 09/27/2023 0129   AST 17 09/27/2023 0129   ALT 15 09/27/2023 0129   ALKPHOS 73 09/27/2023 0129   BILITOT 0.2 09/27/2023 0129   No results found for:  TSH  Medications  Outpatient Encounter Medications as of 10/29/2023  Medication Sig   atorvastatin (LIPITOR) 40 MG tablet Take 40 mg by mouth daily.   baclofen  (LIORESAL ) 10 MG tablet Take 1 tablet (10 mg total) by mouth 3 (three) times daily.   cloNIDine  (CATAPRES ) 0.1 MG tablet Take 1 tablet by mouth 2 (two) times daily.   ibuprofen  (ADVIL ) 600 MG tablet Take 600 mg by mouth every 6 (six) hours as needed for moderate pain (pain score 4-6), mild pain (pain score 1-3) or headache.   metFORMIN (GLUCOPHAGE) 500 MG tablet Take 500 mg by mouth 2 (two) times daily with a meal.   methylPREDNISolone  (MEDROL  DOSEPAK) 4 MG TBPK tablet Take per packet instructions   naproxen  (NAPROSYN ) 500 MG tablet Take 1 tablet (500 mg total) by mouth 2 (two) times daily with a meal.   OZEMPIC, 0.25 OR 0.5 MG/DOSE, 2 MG/3ML SOPN Inject 2.5 mg as directed once a week.   No facility-administered encounter medications on file as of 10/29/2023.     Assessment and Plan   There are no diagnoses linked to this encounter.     Obesity Treatment and Action Plan:  {EMobesityactionplanscribe:28314::Patient will work on garnering support from family and friends to begin weight loss journey.,Will work on eliminating or reducing the presence of highly palatable, calorie dense foods in the home.,Will complete provided nutritional and psychosocial assessment questionnaire before the next appointment.,Will be scheduled for indirect calorimetry to determine resting energy expenditure in a fasting state.  This will allow us  to create a reduced calorie, high-protein meal plan to promote loss of fat mass while preserving muscle mass.,Counseled on the health benefits of losing 5%-15% of total body weight.,Was counseled on nutritional approaches to weight loss and benefits of reducing processed foods and consuming plant-based foods and high quality protein as part of nutritional weight management.,Was counseled on  pharmacotherapy and role as an adjunct in weight management. }  Education and Additional resources  She was weighed on the bioimpedance scale and results were discussed and documented in the synopsis.  We discussed obesity as a progressive, chronic disease and the importance of a more detailed evaluation of all the factors contributing to the disease.  We reviewed the basic principles in obesity management.   We discussed the importance of long term lifestyle changes which include nutrition, exercise and behavioral modification as well as the importance of customizing this to her specific health and social needs.  We reviewed the role of medical interventions including pharmacotherapy and surgical interventions.   We discussed the benefits of reaching a healthier weight to alleviate the symptoms of existing conditions and reduce the risks of the biomechanical, cardiometabolic and psychological effects of obesity.  We reviewed our program approach and philosophy, which are guided by the four pillars of obesity medicine.  We discussed how to prepare for intake appointment and the importance of fasting and avoidance of stimulants for at least 8 hours prior to indirect calorimetry.  April Warner appears to be in the action stage of change and reports being ready to initiate intensive lifestyle and behavioral modifications as part of their weight loss journey.  Attestation  Reviewed by clinician on day of visit: allergies, medications, problem list, medical history, surgical history, family history, social history, and previous  encounter notes pertinent to obesity diagnosis.  I have spent *** minutes in the care of the patient today including: {NUMBER 1-10:22536} minutes before the visit reviewing and preparing the chart. *** minutes face-to-face {emfacetoface:32598::assessing and reviewing listed medical problems as outlined in obesity care plan,providing nutritional and behavioral  counseling on topics outlined in the obesity care plan,independently interpreting test results and goals of care, as described in assessment and plan,reviewing and discussing biometric information and progress} {NUMBER 1-10:22536} minutes after the visit updating chart and documentation of encounter.  Evertte Sones ANP-C
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
## 2023-11-20 ENCOUNTER — Encounter (HOSPITAL_COMMUNITY): Admitting: Psychiatry

## 2023-11-20 ENCOUNTER — Encounter (HOSPITAL_COMMUNITY): Payer: Self-pay

## 2023-11-21 ENCOUNTER — Encounter (HOSPITAL_COMMUNITY): Payer: Self-pay | Admitting: Psychiatry

## 2023-11-21 ENCOUNTER — Telehealth (INDEPENDENT_AMBULATORY_CARE_PROVIDER_SITE_OTHER): Admitting: Psychiatry

## 2023-11-21 DIAGNOSIS — F331 Major depressive disorder, recurrent, moderate: Secondary | ICD-10-CM

## 2023-11-21 DIAGNOSIS — F411 Generalized anxiety disorder: Secondary | ICD-10-CM

## 2023-11-21 MED ORDER — ARIPIPRAZOLE 5 MG PO TABS: 5.0000 mg | ORAL_TABLET | Freq: Every day | ORAL | 3 refills | Status: AC

## 2023-11-21 MED ORDER — PROPRANOLOL HCL 10 MG PO TABS: 10.0000 mg | ORAL_TABLET | Freq: Two times a day (BID) | ORAL | 3 refills | Status: AC

## 2023-11-21 NOTE — Progress Notes (Signed)
 BH MD/PA/NP OP Progress Note Virtual Visit via Video Note  I connected with April Warner on 11/21/23 at  2:30 PM EDT by a video enabled telemedicine application and verified that I am speaking with the correct person using two identifiers.  Location: Patient: Home Provider: Clinic   I discussed the limitations of evaluation and management by telemedicine and the availability of in person appointments. The patient expressed understanding and agreed to proceed.  I provided 30 minutes of non-face-to-face time during this encounter.    11/21/2023 11:12 AM April Warner  MRN:  [id number]  Chief Complaint: I want to restart Xanax  HPI: 43 year old female seen today for follow-up psychiatric evaluation.  She has a psychiatric history of anxiety, depression, substance use (marijuana, cocaine, tobacco).  Today she is well-groomed, pleasant, cooperative, and engaged in conversation.  She informed clinical research associate that hydroxyzine  was ineffective and she would like to restart Xanax.  Reports that she was last prescribed Xanax in jail.  Provider informed patient that this medication would not be trialed and recommended an antidepressant.  Patient was not agreeable as she notes that antidepressants were ineffective in the past.  Patient informed writer that she is worried about finding a job, finding better housing, her relationship with her 47 year old daughter, and staying out of prison.  She notes that she recently got off probation a few days ago and has been out of jail for a year.  She notes that she went to jail for habitual larceny and is hopeful that she will be able to stay out and be productive.  Provider asked patient if she was working with [facility] to help manage transitioning back into society from prison.  She notes that she was.  Provider also gave patient resources to vocational rehab to help find a job.  Patient informed clinical research associate that she wants family counseling for her and her daughter.   Patient informed that at [facility] behavioral health has individual counseling and substance use counseling .  Patient given the number to [facility] of the [place].  Patient reports that above exacerbates her anxiety and depression. Today provider conducted GAD-7 and he scored 19. Provider also conducted PHQ-9 of he scored a 14.  Patient reports that her sleep is poor knowing that she sleeps 2 to 3 hours nightly.  She endorsed having racing thoughts, fluctuations in mood, irritability, and distractibility.  She denies SI/HI/AVH, mania, or paranoia.  Patient reports the death of one of her family notes was traumatic.  She denies flashbacks, nightmares, or avoidant behaviors.  Patient denies current alcohol or illegal drug use.  She does continue to smoke tobacco.  Patient informed writer that she has daily pain in her knees and back.  Provider offered patient Cymbalta however she was not agreeable.  Patient notes that she found antidepressants ineffective and reports that she had trialed Cymbalta in the past.  She also notes that she had trialed BuSpar and gabapentin.  She informed clinical research associate that she felt gabapentin somewhat effective.  Patient is currently taking Suboxone and provider informed patient that gabapentin and Suboxone in combination could cause respiratory distress.  At this time gabapentin not recommended.  Patient does have a history of hypertension and notes that with clonidine  her blood pressure continues to be elevated.  Today she is agreeable to starting Abilify 5 mg to help manage mood. She is also agreeable to start propanol 10 mg twice daily to help manage anxiety. Patient referred to outpatient counseling for therapy.  At this time hydroxyzine  discontinued.Potential side effects of medication and risks vs benefits of treatment vs non-treatment were explained and discussed. All questions were answered.  No other concerns at this time.  Visit Diagnosis:    ICD-10-CM   1.  GAD (generalized anxiety disorder)  F41.1 propranolol (INDERAL) 10 MG tablet    2. Major depressive disorder, recurrent episode, moderate (HCC)  F33.1 ARIPiprazole (ABILIFY) 5 MG tablet      Past Psychiatric History: Major depression and anxiety  Past Medical History:  Past Medical History:  Diagnosis Date   Anxiety    Arthritis    bil knees   Depression    Hypertension    Intermittent explosive disorder    Knee pain    Obesity     Past Surgical History:  Procedure Laterality Date   CESAREAN SECTION     CHOLECYSTECTOMY     DILATION AND CURETTAGE OF UTERUS N/A 09/04/2012   Procedure: DILATATION AND CURETTAGE, Hysteroscopy;  Surgeon: Gloris DELENA Hugger, MD;  Location: [facility];  Service: Gynecology;  Laterality: N/A;    Family Psychiatric History: Denies  Family History:  Family History  Problem Relation Age of Onset   Diabetes Maternal Aunt    Cancer Maternal Aunt    Diabetes Maternal Grandmother    Cancer Paternal Grandmother     Social History:  Social History   Socioeconomic History   Marital status: Single    Spouse name: Not on file   Number of children: Not on file   Years of education: Not on file   Highest education level: Not on file  Occupational History   Not on file  Tobacco Use   Smoking status: Every Day    Current packs/day: 0.50    Types: Cigarettes   Smokeless tobacco: Not on file  Substance and Sexual Activity   Alcohol use: No   Drug use: Not Currently    Comment: past use of cannabis (last March 2025) and cocaine (last in 2019)   Sexual activity: Yes    Birth control/protection: None  Other Topics Concern   Not on file  Social History Narrative   Not on file   Social Drivers of Health   Financial Resource Strain: Not on file  Food Insecurity: Not on file  Transportation Needs: Not on file  Physical Activity: Not on file  Stress: Not on file  Social Connections: Not on file    Allergies:  Allergies  Allergen Reactions    Toradol  [Ketorolac  Tromethamine ] Diarrhea and Nausea And Vomiting    Metabolic Disorder Labs: No results found for: HGBA1C, MPG No results found for: PROLACTIN No results found for: CHOL, TRIG, HDL, CHOLHDL, VLDL, LDLCALC No results found for: TSH  Therapeutic Level Labs: No results found for: LITHIUM No results found for: VALPROATE No results found for: CBMZ  Current Medications: Current Outpatient Medications  Medication Sig Dispense Refill   ARIPiprazole (ABILIFY) 5 MG tablet Take 1 tablet (5 mg total) by mouth daily. 30 tablet 3   propranolol (INDERAL) 10 MG tablet Take 1 tablet (10 mg total) by mouth 2 (two) times daily. 60 tablet 3   atorvastatin (LIPITOR) 40 MG tablet Take 40 mg by mouth daily.     baclofen  (LIORESAL ) 10 MG tablet Take 1 tablet (10 mg total) by mouth 3 (three) times daily. 30 each 0   cloNIDine  (CATAPRES ) 0.1 MG tablet Take 1 tablet by mouth 2 (two) times daily.     ibuprofen  (ADVIL ) 600 MG tablet Take  600 mg by mouth every 6 (six) hours as needed for moderate pain (pain score 4-6), mild pain (pain score 1-3) or headache.     metFORMIN (GLUCOPHAGE) 500 MG tablet Take 500 mg by mouth 2 (two) times daily with a meal.     methylPREDNISolone  (MEDROL  DOSEPAK) 4 MG TBPK tablet Take per packet instructions 21 tablet 0   naproxen  (NAPROSYN ) 500 MG tablet Take 1 tablet (500 mg total) by mouth 2 (two) times daily with a meal. 30 tablet 0   OZEMPIC, 0.25 OR 0.5 MG/DOSE, 2 MG/3ML SOPN Inject 2.5 mg as directed once a week.     No current facility-administered medications for this visit.     Musculoskeletal: Strength & Muscle Tone: within normal limits and Telehealth visit Gait & Station: normal, Telehealth visit Patient leans: N/A  Psychiatric Specialty Exam: Review of Systems  Last menstrual period 07/07/2015.There is no height or weight on file to calculate BMI.  General Appearance: Well Groomed  Eye Contact:  Good  Speech:  Clear and  Coherent and Normal Rate  Volume:  Normal  Mood:  Anxious and Depressed  Affect:  Appropriate and Congruent  Thought Process:  Coherent, Goal Directed, and Linear  Orientation:  Full (Time, Place, and Person)  Thought Content: WDL and Logical   Suicidal Thoughts:  No  Homicidal Thoughts:  No  Memory:  Immediate;   Good Recent;   Good Remote;   Good  Judgement:  Good  Insight:  Good  Psychomotor Activity:  Normal  Concentration:  Concentration: Good and Attention Span: Good  Recall:  Good  Fund of Knowledge: Good  Language: Good  Akathisia:  No  Handed:  Right  AIMS (if indicated): not done  Assets:  Communication Skills Desire for Improvement Housing Leisure Time Physical Health Social Support Transportation  ADL's:  Intact  Cognition: WNL  Sleep:  Fair   Screenings: GAD-7    Flowsheet Row Video Visit from 11/21/2023 in [facility] Counselor from 04/27/2023 in [facility]  Total GAD-7 Score 19 20   PHQ2-9    Flowsheet Row Video Visit from 11/21/2023 in [facility] Counselor from 04/27/2023 in [facility]  PHQ-2 Total Score 4 3  PHQ-9 Total Score 14 13   Flowsheet Row ED from 09/27/2023 in [facility] Emergency Department at [facility] Counselor from 04/27/2023 in [facility] ED from 01/04/2023 in [facility] Emergency Department at [facility]  C-SSRS RISK CATEGORY No Risk No Risk No Risk     Assessment and Plan: Patient notes that hydroxyzine  has been ineffective and at this time wishes to discontinue it. She reports being irritable, distracted, and notes that she has fluctuations in mood. Today she is agreeable to starting Abilify 5 mg to help manage mood. She is also agreeable to start propanol 10 mg twice daily to help manage anxiety. Patient referred to outpatient counseling for therapy.  1. GAD (generalized  anxiety disorder) (Primary)  Start- propranolol (INDERAL) 10 MG tablet; Take 1 tablet (10 mg total) by mouth 2 (two) times daily.  Dispense: 60 tablet; Refill: 3  2. Major depressive disorder, recurrent episode, moderate (HCC)  Start- ARIPiprazole (ABILIFY) 5 MG tablet; Take 1 tablet (5 mg total) by mouth daily.  Dispense: 30 tablet; Refill: 3   Collaboration of Care: Collaboration of Care: Other provider involved in patient's care AEB PCP and counselor  Patient/Guardian was advised Release of  Information must be obtained prior to any record release in order to collaborate their care with an outside provider. Patient/Guardian was advised if they have not already done so to contact the registration department to sign all necessary forms in order for us  to release information regarding their care.   Consent: Patient/Guardian gives verbal consent for treatment and assignment of benefits for services provided during this visit. Patient/Guardian expressed understanding and agreed to proceed.   Follow-up in 3 months Follow-up with therapy Zane FORBES Bach, NP 11/21/2023, 11:12 AM

## 2023-11-22 ENCOUNTER — Institutional Professional Consult (permissible substitution) (INDEPENDENT_AMBULATORY_CARE_PROVIDER_SITE_OTHER): Admitting: Nurse Practitioner

## 2023-11-22 ENCOUNTER — Telehealth (HOSPITAL_COMMUNITY): Payer: Self-pay | Admitting: *Deleted

## 2023-11-22 ENCOUNTER — Telehealth (HOSPITAL_COMMUNITY): Payer: Self-pay

## 2023-11-22 NOTE — Telephone Encounter (Signed)
 [name] from [facility] with [facility] who included a ROI asking for an update on this pt.  This therapist sends the following HIPAA compliant email in response to her request: 'April Warner  is seeing Dr. Marrie for medication management. Her next appointment will be on 02-10-2024.  [name] is schedule for an assessment with me on 01-10-24 to evaluate her and start the therapy process.'  Darice Simpler, MS, LMFT, LCAS

## 2023-11-22 NOTE — Telephone Encounter (Signed)
 Submitted to covermymeds prior authorization for patients aripiprazole. Waiting on their response

## 2023-11-22 NOTE — Telephone Encounter (Signed)
 Approval from [facility] for patients aripiprazole. Pharmacy notified of this approval.

## 2023-11-26 ENCOUNTER — Encounter: Payer: Self-pay | Admitting: Radiology

## 2023-12-07 ENCOUNTER — Encounter (HOSPITAL_BASED_OUTPATIENT_CLINIC_OR_DEPARTMENT_OTHER): Payer: Self-pay

## 2023-12-07 ENCOUNTER — Ambulatory Visit (HOSPITAL_BASED_OUTPATIENT_CLINIC_OR_DEPARTMENT_OTHER): Admitting: Physical Therapy

## 2023-12-14 ENCOUNTER — Ambulatory Visit (HOSPITAL_BASED_OUTPATIENT_CLINIC_OR_DEPARTMENT_OTHER): Admitting: Physical Therapy

## 2023-12-14 ENCOUNTER — Ambulatory Visit (HOSPITAL_BASED_OUTPATIENT_CLINIC_OR_DEPARTMENT_OTHER): Attending: Student | Admitting: Physical Therapy

## 2023-12-14 ENCOUNTER — Encounter (HOSPITAL_BASED_OUTPATIENT_CLINIC_OR_DEPARTMENT_OTHER): Payer: Self-pay

## 2023-12-14 NOTE — Therapy (Deleted)
 OUTPATIENT PHYSICAL THERAPY LOWER EXTREMITY EVALUATION   Patient Name: April Warner MRN: [id number] DOB:Jun 20, 1980, 43 y.o., female Today's Date: 12/14/2023  END OF SESSION:   Past Medical History:  Diagnosis Date   Anxiety    Arthritis    bil knees   Depression    Hypertension    Intermittent explosive disorder    Knee pain    Obesity    Past Surgical History:  Procedure Laterality Date   CESAREAN SECTION     CHOLECYSTECTOMY     DILATION AND CURETTAGE OF UTERUS N/A 09/04/2012   Procedure: DILATATION AND CURETTAGE, Hysteroscopy;  Surgeon: Gloris DELENA Hugger, MD;  Location: [facility];  Service: Gynecology;  Laterality: N/A;   Patient Active Problem List   Diagnosis Date Noted   Major depressive disorder, recurrent episode, moderate (HCC) 04/27/2023   GAD (generalized anxiety disorder) 04/27/2023   Lower urinary tract infectious disease 08/27/2013   DUB (dysfunctional uterine bleeding) 09/04/2012    PCP: Delorise Daring FNP  REFERRING PROVIDER: Leonce Reveal PA-  REFERRING DIAG: M17.11 (ICD-10-CM) - Unilateral primary osteoarthritis, right knee   THERAPY DIAG:  No diagnosis found.  Rationale for Evaluation and Treatment: Rehabilitation  ONSET DATE: ***  SUBJECTIVE:   SUBJECTIVE STATEMENT: ***  PERTINENT HISTORY: *** PAIN:  Are you having pain? Yes: NPRS scale: *** Pain location: *** Pain description: *** Aggravating factors: *** Relieving factors: ***  PRECAUTIONS: {Therapy precautions:24002}  RED FLAGS: {PT Red Flags:29287}   WEIGHT BEARING RESTRICTIONS: {Yes ***/No:24003}  FALLS:  Has patient fallen in last 6 months? {fallsyesno:27318}  LIVING ENVIRONMENT: Lives with: {OPRC lives with:25569::lives with their family} Lives in: {Lives in:25570} Stairs: {opstairs:27293} Has following equipment at home: {Assistive devices:23999}  OCCUPATION: ***  PLOF: {PLOF:24004}  PATIENT GOALS: ***  NEXT MD VISIT: ***  OBJECTIVE:  Note:  Objective measures were completed at Evaluation unless otherwise noted.  DIAGNOSTIC FINDINGS: 9/25 Xray Right knee IMPRESSION: 1. Moderate tricompartmental degenerative arthritis.  PATIENT SURVEYS:  LEFS  COGNITION: Overall cognitive status: {cognition:24006}     SENSATION: {sensation:27233}  EDEMA:  {edema:24020}  MUSCLE LENGTH: Hamstrings: Right *** deg; Left *** deg Debby test: Right *** deg; Left *** deg  POSTURE: {posture:25561}  PALPATION: ***  LOWER EXTREMITY ROM:  {AROM/PROM:27142} ROM Right eval Left eval  Hip flexion    Hip extension    Hip abduction    Hip adduction    Hip internal rotation    Hip external rotation    Knee flexion    Knee extension    Ankle dorsiflexion    Ankle plantarflexion    Ankle inversion    Ankle eversion     (Blank rows = not tested)  LOWER EXTREMITY MMT:  MMT Right eval Left eval  Hip flexion    Hip extension    Hip abduction    Hip adduction    Hip internal rotation    Hip external rotation    Knee flexion    Knee extension    Ankle dorsiflexion    Ankle plantarflexion    Ankle inversion    Ankle eversion     (Blank rows = not tested)  LOWER EXTREMITY SPECIAL TESTS:  {LEspecialtests:26242}  FUNCTIONAL TESTS:  {Functional tests:24029}  GAIT: Distance walked: *** Assistive device utilized: {Assistive devices:23999} Level of assistance: {Levels of assistance:24026} Comments: ***  TREATMENT DATE: ***    PATIENT EDUCATION:  Education details: *** Person educated: {Person educated:25204} Education method: {Education Method:25205} Education comprehension: {Education Comprehension:25206}  HOME EXERCISE PROGRAM: ***  ASSESSMENT:  CLINICAL IMPRESSION: Patient is a *** y.o. *** who was seen today for physical therapy evaluation and treatment for ***.   OBJECTIVE  IMPAIRMENTS: {opptimpairments:25111}.   ACTIVITY LIMITATIONS: {activitylimitations:27494}  PARTICIPATION LIMITATIONS: {participationrestrictions:25113}  PERSONAL FACTORS: {Personal factors:25162} are also affecting patient's functional outcome.   REHAB POTENTIAL: {rehabpotential:25112}  CLINICAL DECISION MAKING: {clinical decision making:25114}  EVALUATION COMPLEXITY: {Evaluation complexity:25115}   GOALS: Goals reviewed with patient? {yes/no:20286}  SHORT TERM GOALS: Target date: *** *** Baseline: Goal status: INITIAL  2.  *** Baseline:  Goal status: INITIAL  3.  *** Baseline:  Goal status: INITIAL  4.  *** Baseline:  Goal status: INITIAL  5.  *** Baseline:  Goal status: INITIAL  6.  *** Baseline:  Goal status: INITIAL  LONG TERM GOALS: Target date: ***  *** Baseline:  Goal status: INITIAL  2.  *** Baseline:  Goal status: INITIAL  3.  *** Baseline:  Goal status: INITIAL  4.  *** Baseline:  Goal status: INITIAL  5.  *** Baseline:  Goal status: INITIAL  6.  *** Baseline:  Goal status: INITIAL   PLAN:  PT FREQUENCY: {rehab frequency:25116}  PT DURATION: {rehab duration:25117}  PLANNED INTERVENTIONS: {rehab planned interventions:25118::97110-Therapeutic exercises,97530- Therapeutic [phone]- Neuromuscular re-education,97535- Self Rjmz,02859- Manual therapy,Patient/Family education}  PLAN FOR NEXT SESSION: PIERRETTE Shuck [place]) Chancey Cullinane MPT 12/14/23 10:47 AM [facility] [address] [place], [place], [address] Phone: [phone]   Fax:  [phone]

## 2023-12-26 ENCOUNTER — Encounter: Payer: Self-pay | Admitting: Family Medicine

## 2023-12-26 ENCOUNTER — Telehealth: Admitting: Family Medicine

## 2023-12-26 ENCOUNTER — Ambulatory Visit (HOSPITAL_BASED_OUTPATIENT_CLINIC_OR_DEPARTMENT_OTHER): Admitting: Physical Therapy

## 2023-12-26 NOTE — Progress Notes (Signed)
 [place]   Needs ED eval- can't walk

## 2024-01-01 ENCOUNTER — Encounter (HOSPITAL_COMMUNITY): Payer: Self-pay | Admitting: Emergency Medicine

## 2024-01-01 ENCOUNTER — Emergency Department (HOSPITAL_COMMUNITY)
Admission: EM | Admit: 2024-01-01 | Discharge: 2024-01-01 | Disposition: A | Discharge status: Home / Self Care | Attending: Emergency Medicine | Admitting: Emergency Medicine

## 2024-01-01 ENCOUNTER — Other Ambulatory Visit: Payer: Self-pay

## 2024-01-01 DIAGNOSIS — M5442 Lumbago with sciatica, left side: Secondary | ICD-10-CM

## 2024-01-01 MED ORDER — OXYCODONE HCL 5 MG PO TABS
10.0000 mg | ORAL_TABLET | Freq: Once | ORAL | Status: AC
  Administered 2024-01-01: 10 mg via ORAL
  Filled 2024-01-01: qty 2

## 2024-01-01 MED ORDER — DIAZEPAM 5 MG PO TABS
5.0000 mg | ORAL_TABLET | Freq: Once | ORAL | Status: AC
  Administered 2024-01-01: 5 mg via ORAL
  Filled 2024-01-01: qty 1

## 2024-01-01 MED ORDER — LIDOCAINE 5 % EX PTCH
1.0000 | MEDICATED_PATCH | CUTANEOUS | Status: DC
  Administered 2024-01-01: 1 via TRANSDERMAL
  Filled 2024-01-01: qty 1

## 2024-01-01 MED ORDER — CYCLOBENZAPRINE HCL 10 MG PO TABS: 10.0000 mg | ORAL_TABLET | Freq: Three times a day (TID) | ORAL | 0 refills | Status: AC

## 2024-01-01 MED ORDER — ACETAMINOPHEN 500 MG PO TABS
1000.0000 mg | ORAL_TABLET | Freq: Once | ORAL | Status: AC
  Administered 2024-01-01: 1000 mg via ORAL
  Filled 2024-01-01: qty 2

## 2024-01-01 MED ORDER — PREDNISONE 20 MG PO TABS: 40.0000 mg | ORAL_TABLET | Freq: Every day | ORAL | 0 refills | Status: AC

## 2024-01-01 NOTE — ED Triage Notes (Signed)
 PT here for 10/10 lower back pain that shoots down left leg into feet beginning night before last.

## 2024-01-01 NOTE — ED Notes (Signed)
 Pt able to swallow pills whole. [employer] at bedside. Pt eating and drinking without difficulty.

## 2024-01-01 NOTE — ED Notes (Signed)
 Pt ambulated to bathroom with mild difficulty

## 2024-01-01 NOTE — ED Notes (Signed)
 Pt observed ambulating to bathroom, steady gait noted. Spouse remains at bedside.

## 2024-01-01 NOTE — Discharge Instructions (Addendum)
I have prescribed muscle relaxers for your pain, please do not drink or drive while taking this medications as it can make you drowsy.   I have also prescribed steroids, be aware this medication can cause insomnia, appetite changes.    Please follow-up with PCP in 1 week for reevaluation of your symptoms.If you experience any bowel or bladder incontinence, fever, worsening in your symptoms please return to the ED.

## 2024-01-01 NOTE — ED Provider Notes (Signed)
 [place] [facility] AT [facility] Provider Note   CSN: [id number] Arrival date & time: 01/01/24  9061     Patient presents with: Back Pain   April Warner is a 43 y.o. female.   43 y.o female with a PMH of sciatica presents to the ED with a chief complaint of left lumbar pain x last night.. She describes pain along the left side of her buttocks radiating down her left leg worsen with any time of ambulation. This is a recurrent problem for patient. Has tried muscle relaxer and naproxen  with no improvement in symptoms. She did ambulate into the ED. No alleviating factors. No fever, no IVDU, no bowel or bladder complaints.   The history is provided by the patient.       Prior to Admission medications   Medication Sig Start Date End Date Taking? Authorizing Provider  cyclobenzaprine  (FLEXERIL ) 10 MG tablet Take 1 tablet (10 mg total) by mouth 3 (three) times daily for 7 days. 01/01/24 01/08/24 Yes Sallyann Kinnaird, PA-C  predniSONE  (DELTASONE ) 20 MG tablet Take 2 tablets (40 mg total) by mouth daily for 5 days. 01/01/24 01/06/24 Yes Emmilyn Crooke, PA-C  ARIPiprazole  (ABILIFY ) 5 MG tablet Take 1 tablet (5 mg total) by mouth daily. 11/21/23   Harl Zane BRAVO, NP  atorvastatin (LIPITOR) 40 MG tablet Take 40 mg by mouth daily. 04/19/23   [provider]  cloNIDine  (CATAPRES ) 0.1 MG tablet Take 1 tablet by mouth 2 (two) times daily. 07/03/23   [provider]  ibuprofen  (ADVIL ) 600 MG tablet Take 600 mg by mouth every 6 (six) hours as needed for moderate pain (pain score 4-6), mild pain (pain score 1-3) or headache.    [provider]  metFORMIN (GLUCOPHAGE) 500 MG tablet Take 500 mg by mouth 2 (two) times daily with a meal. 08/02/23   [provider]  naproxen  (NAPROSYN ) 500 MG tablet Take 1 tablet (500 mg total) by mouth 2 (two) times daily with a meal. 06/27/23   Burnette, Delon HERO, PA-C  OZEMPIC, 0.25 OR 0.5 MG/DOSE, 2 MG/3ML SOPN  Inject 2.5 mg as directed once a week. 08/02/23   [provider]  propranolol  (INDERAL ) 10 MG tablet Take 1 tablet (10 mg total) by mouth 2 (two) times daily. 11/21/23   Harl Zane BRAVO, NP    Allergies: Toradol  [ketorolac  tromethamine ]    Review of Systems  Constitutional:  Negative for fever.  Respiratory:  Negative for shortness of breath.   Musculoskeletal:  Positive for back pain and myalgias.    Updated Vital Signs BP (!) 154/93 (BP Location: Left Arm)   Pulse 86   Temp 97.7 F (36.5 C) (Oral)   Resp (!) 26   LMP 07/07/2015 (Approximate)   SpO2 96%   Physical Exam Vitals and nursing note reviewed.  Constitutional:      Appearance: Normal appearance. She is obese.  HENT:     Head: Normocephalic and atraumatic.     Mouth/Throat:     Mouth: Mucous membranes are moist.  Cardiovascular:     Rate and Rhythm: Normal rate.  Pulmonary:     Effort: Pulmonary effort is normal.  Abdominal:     General: Abdomen is flat.  Musculoskeletal:     Lumbar back: Spasms and tenderness present. Decreased range of motion.  Skin:    General: Skin is warm and dry.  Neurological:     Mental Status: She is alert and oriented to person, place, and time.     (  all labs ordered are listed, but only abnormal results are displayed) Labs Reviewed - No data to display  EKG: None  Radiology: No results found.   Procedures   Medications Ordered in the ED  lidocaine  (LIDODERM ) 5 % 1 patch (1 patch Transdermal Patch Applied 01/01/24 1117)  diazepam  (VALIUM ) tablet 5 mg (5 mg Oral Given 01/01/24 1117)  acetaminophen  (TYLENOL ) tablet 1,000 mg (1,000 mg Oral Given 01/01/24 1118)  oxyCODONE  (Oxy IR/ROXICODONE ) immediate release tablet 10 mg (10 mg Oral Given 01/01/24 1117)                                    Medical Decision Making Risk OTC drugs. Prescription drug management.      Patient presented to the ED with a chief complaint of low back pain that began yesterday,  reports chronic history of back pain and according to review of records extensively by me.  It does seem to be a recurrent complaint.  She was previously put on steroids in the month of October.  She denies any prior history of diabetes.  She tells me that the pain is ongoing radiating from her left lumbar spine down her left leg.  She has tried some Biofreeze, anti-inflammatories without any improvement in symptoms.  According to her records as well, she has a prior prescription for baclofen , which she reports she has been taking.  No underlying history of cancer, no IV drug use, no bowel or bladder complaints, no fevers.  She does have antalgic gait on arrival to the ED, is able to lift both of her legs up with some pain.  Sensation is intact throughout.  Given medication to help with pain control here, with improvement in her symptoms.  We discussed close follow-up with PCP.  She will go home on a short course of steroids along with muscle relaxers to help with pain control.  She is agreeable to plan and treatment.  Patient stable for discharge.  Portions of this note were generated with Scientist, clinical (histocompatibility and immunogenetics). Dictation errors may occur despite best attempts at proofreading.   Final diagnoses:  Acute left-sided low back pain with left-sided sciatica    ED Discharge Orders          Ordered    predniSONE  (DELTASONE ) 20 MG tablet  Daily        01/01/24 1234    cyclobenzaprine  (FLEXERIL ) 10 MG tablet  3 times daily        01/01/24 1234               Alpha Mysliwiec, PA-C 01/01/24 1241    Garrick Charleston, MD 01/01/24 1502

## 2024-01-02 ENCOUNTER — Ambulatory Visit (HOSPITAL_BASED_OUTPATIENT_CLINIC_OR_DEPARTMENT_OTHER): Admitting: Physical Therapy

## 2024-01-09 ENCOUNTER — Ambulatory Visit (HOSPITAL_BASED_OUTPATIENT_CLINIC_OR_DEPARTMENT_OTHER): Admitting: Student

## 2024-01-09 ENCOUNTER — Ambulatory Visit: Admitting: Physical Medicine and Rehabilitation

## 2024-01-10 ENCOUNTER — Ambulatory Visit (HOSPITAL_COMMUNITY)

## 2024-01-10 ENCOUNTER — Encounter (HOSPITAL_COMMUNITY): Payer: Self-pay

## 2024-01-23 ENCOUNTER — Ambulatory Visit (HOSPITAL_BASED_OUTPATIENT_CLINIC_OR_DEPARTMENT_OTHER): Attending: Student | Admitting: Physical Therapy

## 2024-01-31 ENCOUNTER — Encounter (HOSPITAL_COMMUNITY): Admitting: Student in an Organized Health Care Education/Training Program
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
## 2024-02-06 ENCOUNTER — Ambulatory Visit (HOSPITAL_BASED_OUTPATIENT_CLINIC_OR_DEPARTMENT_OTHER): Admitting: Student
